# Patient Record
Sex: Male | Born: 1970 | Race: White | Hispanic: No | Smoking: Former smoker
Health system: Southern US, Community
[De-identification: ages and names within clinical notes are randomized; demographics above are authoritative.]

## PROBLEM LIST (undated history)

## (undated) DIAGNOSIS — G47419 Narcolepsy without cataplexy: Secondary | ICD-10-CM

## (undated) DIAGNOSIS — S069XAA Unspecified intracranial injury with loss of consciousness status unknown, initial encounter: Secondary | ICD-10-CM

## (undated) DIAGNOSIS — R262 Difficulty in walking, not elsewhere classified: Secondary | ICD-10-CM

## (undated) DIAGNOSIS — Z9889 Other specified postprocedural states: Secondary | ICD-10-CM

## (undated) DIAGNOSIS — R42 Dizziness and giddiness: Secondary | ICD-10-CM

## (undated) DIAGNOSIS — E079 Disorder of thyroid, unspecified: Secondary | ICD-10-CM

## (undated) DIAGNOSIS — R51 Headache: Secondary | ICD-10-CM

## (undated) DIAGNOSIS — N200 Calculus of kidney: Secondary | ICD-10-CM

## (undated) DIAGNOSIS — R479 Unspecified speech disturbances: Secondary | ICD-10-CM

## (undated) DIAGNOSIS — S069X9A Unspecified intracranial injury with loss of consciousness of unspecified duration, initial encounter: Secondary | ICD-10-CM

## (undated) DIAGNOSIS — R5383 Other fatigue: Secondary | ICD-10-CM

## (undated) DIAGNOSIS — R519 Headache, unspecified: Secondary | ICD-10-CM

## (undated) HISTORY — DX: Calculus of kidney: N20.0

## (undated) HISTORY — PX: HERNIA REPAIR: SHX51

## (undated) HISTORY — DX: Headache, unspecified: R51.9

## (undated) HISTORY — DX: Headache: R51

## (undated) HISTORY — PX: APPENDECTOMY: SHX54

## (undated) HISTORY — DX: Unspecified speech disturbances: R47.9

## (undated) HISTORY — PX: LITHOTRIPSY: SUR834

## (undated) HISTORY — DX: Other fatigue: R53.83

## (undated) HISTORY — PX: FACIAL RECONSTRUCTION SURGERY: SHX631

## (undated) HISTORY — DX: Dizziness and giddiness: R42

## (undated) HISTORY — DX: Difficulty in walking, not elsewhere classified: R26.2

---

## 1997-09-10 ENCOUNTER — Emergency Department (HOSPITAL_COMMUNITY): Admission: EM | Admit: 1997-09-10 | Discharge: 1997-09-10 | Payer: Self-pay | Admitting: Emergency Medicine

## 1997-11-01 ENCOUNTER — Emergency Department (HOSPITAL_COMMUNITY): Admission: EM | Admit: 1997-11-01 | Discharge: 1997-11-01 | Payer: Self-pay | Admitting: *Deleted

## 1997-11-09 ENCOUNTER — Ambulatory Visit (HOSPITAL_COMMUNITY): Admission: RE | Admit: 1997-11-09 | Discharge: 1997-11-09 | Payer: Self-pay | Admitting: *Deleted

## 1999-03-29 ENCOUNTER — Ambulatory Visit (HOSPITAL_COMMUNITY): Admission: RE | Admit: 1999-03-29 | Discharge: 1999-03-29 | Payer: Self-pay

## 1999-03-29 ENCOUNTER — Encounter (INDEPENDENT_AMBULATORY_CARE_PROVIDER_SITE_OTHER): Payer: Self-pay | Admitting: Specialist

## 1999-09-11 ENCOUNTER — Emergency Department (HOSPITAL_COMMUNITY): Admission: EM | Admit: 1999-09-11 | Discharge: 1999-09-11 | Payer: Self-pay | Admitting: Internal Medicine

## 1999-09-11 ENCOUNTER — Encounter: Payer: Self-pay | Admitting: Emergency Medicine

## 2003-10-17 ENCOUNTER — Emergency Department (HOSPITAL_COMMUNITY): Admission: EM | Admit: 2003-10-17 | Discharge: 2003-10-17 | Payer: Self-pay | Admitting: Emergency Medicine

## 2004-04-19 ENCOUNTER — Emergency Department (HOSPITAL_COMMUNITY): Admission: EM | Admit: 2004-04-19 | Discharge: 2004-04-19 | Payer: Self-pay | Admitting: Emergency Medicine

## 2004-11-13 ENCOUNTER — Emergency Department (HOSPITAL_COMMUNITY): Admission: EM | Admit: 2004-11-13 | Discharge: 2004-11-13 | Payer: Self-pay | Admitting: Emergency Medicine

## 2004-12-06 ENCOUNTER — Ambulatory Visit (HOSPITAL_COMMUNITY): Admission: RE | Admit: 2004-12-06 | Discharge: 2004-12-06 | Payer: Self-pay | Admitting: *Deleted

## 2005-06-10 ENCOUNTER — Emergency Department: Payer: Self-pay | Admitting: Emergency Medicine

## 2006-02-07 ENCOUNTER — Emergency Department (HOSPITAL_COMMUNITY): Admission: EM | Admit: 2006-02-07 | Discharge: 2006-02-08 | Payer: Self-pay | Admitting: Emergency Medicine

## 2008-11-20 ENCOUNTER — Emergency Department (HOSPITAL_BASED_OUTPATIENT_CLINIC_OR_DEPARTMENT_OTHER): Admission: EM | Admit: 2008-11-20 | Discharge: 2008-11-20 | Payer: Self-pay | Admitting: Emergency Medicine

## 2010-07-07 NOTE — Op Note (Signed)
Alex Crawford, Alex Crawford                ACCOUNT NO.:  [id number]   MEDICAL RECORD NO.:  [id number]          PATIENT TYPE:  AMB   LOCATION:  DAY                          FACILITY:  [facility]   PHYSICIAN:  Vikki Ports, MDDATE OF BIRTH:  Aug 20, 1970   DATE OF PROCEDURE:  12/06/2004  DATE OF DISCHARGE:                                 OPERATIVE REPORT   PREOPERATIVE DIAGNOSIS:  Right inguinal hernia.   POSTOPERATIVE DIAGNOSIS:  Right inguinal hernia, varicocele.   PROCEDURE:  Ligation of varicocele and right inguinal hernia repair with  mesh.   ANESTHESIA:  General.   SURGEON:  Vikki Ports, MD   DESCRIPTION:  The patient was taken to the operating room and placed in a  supine position.  After adequate general anesthesia was induced using  endotracheal tube, the right groin and perineum were prepped and draped in a  normal sterile fashion.  Using an oblique incision over the right inguinal  canal, I dissected down through subcutaneous tissue onto the external  oblique fascia.  This was opened along its fibers down to the external ring.  The ilioinguinal nerve was not identified. The spermatic cord was surrounded  down at the external ring.  A very, very large indirect hernia sac was  identified and dissected off the cord.  It was so large at the mouth of the  internal ring, I opted to open the hernia sac and use a purse-string 2-0  silk ligature to close it from the inside of the sac.  There was some bowel  contained within it, which I reduced prior to closing the sac.  The distal  part of the sac was resected.  There was a very large varicocele, which was  injured while doing the purse-string suture, which caused a small hematoma  in the cord, which was controlled with small clamps and ligation.  The vas  deferens was preserved, as was the blood supply to the testicle.  The floor  of Hesselbach's triangle was actually fairly well intact, but the internal  ring was so wide  open, I opted to use a Prolene hernia system so that I  could place some preperitoneal mesh.  The area around the internal ring was  bluntly dissected with my finger and with a sponge.  The preperitoneal  portion of the Prolene mesh, Prolene hernia system, was then placed in the  pre-peritoneal space.  The onlay was then tacked to the external ring at the  pubic tubercle along the shelving edge of Cooper's ligament into the  transversalis fascia, split and brought around the spermatic cord and tacked  out laterally with running 2-0 Prolene sutures.  I was satisfied with the  repair.  Adequate hemostasis was insured.  The external oblique fascia was  closed with running 3-0 Vicryl.  Skin was closed with staples.  Patient  tolerated the procedure well and went to the PACU in good condition.      Vikki Ports, MD  Electronically Signed     KRH/MEDQ  D:  12/06/2004  T:  12/06/2004  Job:  [id number]

## 2013-10-08 ENCOUNTER — Emergency Department (HOSPITAL_BASED_OUTPATIENT_CLINIC_OR_DEPARTMENT_OTHER)
Admission: EM | Admit: 2013-10-08 | Discharge: 2013-10-08 | Disposition: A | Discharge status: Home / Self Care | Payer: Self-pay | Attending: Emergency Medicine | Admitting: Emergency Medicine

## 2013-10-08 ENCOUNTER — Encounter (HOSPITAL_BASED_OUTPATIENT_CLINIC_OR_DEPARTMENT_OTHER): Payer: Self-pay | Admitting: Emergency Medicine

## 2013-10-08 DIAGNOSIS — N489 Disorder of penis, unspecified: Secondary | ICD-10-CM | POA: Insufficient documentation

## 2013-10-08 DIAGNOSIS — E079 Disorder of thyroid, unspecified: Secondary | ICD-10-CM | POA: Insufficient documentation

## 2013-10-08 DIAGNOSIS — Z79899 Other long term (current) drug therapy: Secondary | ICD-10-CM | POA: Insufficient documentation

## 2013-10-08 DIAGNOSIS — L089 Local infection of the skin and subcutaneous tissue, unspecified: Secondary | ICD-10-CM | POA: Insufficient documentation

## 2013-10-08 DIAGNOSIS — Z8669 Personal history of other diseases of the nervous system and sense organs: Secondary | ICD-10-CM | POA: Insufficient documentation

## 2013-10-08 DIAGNOSIS — Z87442 Personal history of urinary calculi: Secondary | ICD-10-CM | POA: Insufficient documentation

## 2013-10-08 DIAGNOSIS — Z792 Long term (current) use of antibiotics: Secondary | ICD-10-CM | POA: Insufficient documentation

## 2013-10-08 DIAGNOSIS — F17200 Nicotine dependence, unspecified, uncomplicated: Secondary | ICD-10-CM | POA: Insufficient documentation

## 2013-10-08 HISTORY — DX: Narcolepsy without cataplexy: G47.419

## 2013-10-08 HISTORY — DX: Disorder of thyroid, unspecified: E07.9

## 2013-10-08 HISTORY — DX: Calculus of kidney: N20.0

## 2013-10-08 MED ORDER — SULFAMETHOXAZOLE-TRIMETHOPRIM 800-160 MG PO TABS: 2.0000 | ORAL_TABLET | Freq: Two times a day (BID) | ORAL | Status: DC

## 2013-10-08 NOTE — ED Notes (Signed)
Pt c/o "spider bite to penis" x 1 d ay

## 2013-10-08 NOTE — ED Notes (Signed)
NP at bedside.

## 2013-10-08 NOTE — ED Provider Notes (Signed)
CSN: [id number]     Arrival date & time 10/08/13  1511 History   First MD Initiated Contact with Patient 10/08/13 1540     Chief Complaint  Patient presents with  . Insect Bite     (Consider location/radiation/quality/duration/timing/severity/associated sxs/prior Treatment) HPI Comments: Pt state that he felt a pain near his penis last night and he thinks he was bit by something. Pt states that it feels like there it a bump to the area. No drainage. No problems with urination. No vaginal discharge.  The history is provided by the patient. No language interpreter was used.    Past Medical History  Diagnosis Date  . Thyroid disease   . Kidney stone   . Narcolepsy    Past Surgical History  Procedure Laterality Date  . Facial reconstruction surgery    . Appendectomy    . Hernia repair    . Lithotripsy     History reviewed. No pertinent family history. History  Substance Use Topics  . Smoking status: Current Every Day Smoker -- 1.00 packs/day    Types: Cigarettes  . Smokeless tobacco: Not on file  . Alcohol Use: No    Review of Systems  Constitutional: Negative.   Respiratory: Negative.   Cardiovascular: Negative.       Allergies  Review of patient's allergies indicates no known allergies.  Home Medications   Prior to Admission medications   Medication Sig Start Date End Date Taking? Authorizing Provider  amphetamine-dextroamphetamine (ADDERALL) 30 MG tablet Take 30 mg by mouth daily.   Yes Historical Provider, MD  levothyroxine (SYNTHROID, LEVOTHROID) 150 MCG tablet Take 150 mcg by mouth daily before breakfast.   Yes Historical Provider, MD  sulfamethoxazole-trimethoprim (SEPTRA DS) 800-160 MG per tablet Take 2 tablets by mouth 2 (two) times daily. 10/08/13   Teressa LowerVrinda Alondra Vandeven, NP   BP 116/83  Pulse 72  Temp(Src) 97.7 F (36.5 C) (Oral)  Resp 16  Ht 5\' 5"  (1.651 m)  Wt 120 lb (54.432 kg)  BMI 19.97 kg/m2  SpO2 100% Physical Exam  Nursing note and vitals  reviewed. Constitutional: He is oriented to person, place, and time. He appears well-developed and well-nourished.  Cardiovascular: Normal rate and regular rhythm.   Pulmonary/Chest: Effort normal and breath sounds normal.  Genitourinary:  Small raised red area to shaft of the penis. No induration or fluctuance noted to the area. Area measures about 1 cm in diameter  Musculoskeletal: Normal range of motion.  Neurological: He is alert and oriented to person, place, and time.    ED Course  Procedures (including critical care time) Labs Review Labs Reviewed - No data to display  Imaging Review No results found.   EKG Interpretation None      MDM   Final diagnoses:  Skin infection    Area consistent with folliculitis vs. Early abscess. Will treat with bactrim. Discussed return precautions with pt    Teressa LowerVrinda Lenzie Montesano, NP 10/08/13 1616

## 2013-10-08 NOTE — ED Provider Notes (Signed)
Medical screening examination/treatment/procedure(s) were performed by non-physician practitioner and as supervising physician I was immediately available for consultation/collaboration.   EKG Interpretation None        David H Yao, MD 10/08/13 2232 

## 2013-10-11 ENCOUNTER — Encounter (HOSPITAL_BASED_OUTPATIENT_CLINIC_OR_DEPARTMENT_OTHER): Payer: Self-pay | Admitting: Emergency Medicine

## 2013-10-11 ENCOUNTER — Emergency Department (HOSPITAL_BASED_OUTPATIENT_CLINIC_OR_DEPARTMENT_OTHER)
Admission: EM | Admit: 2013-10-11 | Discharge: 2013-10-11 | Disposition: A | Discharge status: Home / Self Care | Payer: Self-pay | Attending: Emergency Medicine | Admitting: Emergency Medicine

## 2013-10-11 DIAGNOSIS — Z8639 Personal history of other endocrine, nutritional and metabolic disease: Secondary | ICD-10-CM | POA: Insufficient documentation

## 2013-10-11 DIAGNOSIS — Z862 Personal history of diseases of the blood and blood-forming organs and certain disorders involving the immune mechanism: Secondary | ICD-10-CM | POA: Insufficient documentation

## 2013-10-11 DIAGNOSIS — Z792 Long term (current) use of antibiotics: Secondary | ICD-10-CM | POA: Insufficient documentation

## 2013-10-11 DIAGNOSIS — F17200 Nicotine dependence, unspecified, uncomplicated: Secondary | ICD-10-CM | POA: Insufficient documentation

## 2013-10-11 DIAGNOSIS — Z8669 Personal history of other diseases of the nervous system and sense organs: Secondary | ICD-10-CM | POA: Insufficient documentation

## 2013-10-11 DIAGNOSIS — N4829 Other inflammatory disorders of penis: Secondary | ICD-10-CM | POA: Insufficient documentation

## 2013-10-11 DIAGNOSIS — R109 Unspecified abdominal pain: Secondary | ICD-10-CM | POA: Insufficient documentation

## 2013-10-11 DIAGNOSIS — N489 Disorder of penis, unspecified: Secondary | ICD-10-CM

## 2013-10-11 DIAGNOSIS — Z79899 Other long term (current) drug therapy: Secondary | ICD-10-CM | POA: Insufficient documentation

## 2013-10-11 DIAGNOSIS — Z87442 Personal history of urinary calculi: Secondary | ICD-10-CM | POA: Insufficient documentation

## 2013-10-11 MED ORDER — AZITHROMYCIN 250 MG PO TABS
1000.0000 mg | ORAL_TABLET | Freq: Every day | ORAL | Status: DC
  Filled 2013-10-11: qty 4

## 2013-10-11 MED ORDER — AZITHROMYCIN 250 MG PO TABS
1000.0000 mg | ORAL_TABLET | Freq: Every day | ORAL | Status: DC
  Administered 2013-10-11: 1000 mg via ORAL

## 2013-10-11 NOTE — Discharge Instructions (Signed)
FOLLOW UP WITH YOUR DOCTOR FOR RECHECK IN 2-3 DAYS. CONTINUE THE ANTIBIOTICS YOU ARE CURRENTLY TAKING UNTIL GONE. RETURN HERE AS NEEDED.

## 2013-10-11 NOTE — ED Provider Notes (Signed)
CSN: [id number]     Arrival date & time 10/11/13  2146 History   First MD Initiated Contact with Patient 10/11/13 2158     Chief Complaint  Patient presents with  . Abscess     (Consider location/radiation/quality/duration/timing/severity/associated sxs/prior Treatment) Patient is a 43 y.o. male presenting with abscess. The history is provided by the patient. No language interpreter was used.  Abscess Location:  Ano-genital Ano-genital abscess location:  Penis Associated symptoms: no fever, no nausea and no vomiting   Associated symptoms comment:  Painful lesion to shaft of penis that is worsening. He was seen in the emergency room 2 nights ago and started on antibiotics for an abscess. He states that the lesion opened and drained earlier today causing concern that it was worsening. He reports abdominal pain but no fever or vomiting. No dysuria.    Past Medical History  Diagnosis Date  . Thyroid disease   . Kidney stone   . Narcolepsy    Past Surgical History  Procedure Laterality Date  . Facial reconstruction surgery    . Appendectomy    . Hernia repair    . Lithotripsy     History reviewed. No pertinent family history. History  Substance Use Topics  . Smoking status: Current Every Day Smoker -- 1.00 packs/day    Types: Cigarettes  . Smokeless tobacco: Not on file  . Alcohol Use: No    Review of Systems  Constitutional: Negative for fever and chills.  Gastrointestinal: Positive for abdominal pain. Negative for nausea and vomiting.  Genitourinary: Positive for penile pain. Negative for dysuria.  Musculoskeletal: Negative.  Negative for myalgias.  Skin: Positive for wound.  Neurological: Negative.       Allergies  Review of patient's allergies indicates no known allergies.  Home Medications   Prior to Admission medications   Medication Sig Start Date End Date Taking? Authorizing Provider  amphetamine-dextroamphetamine (ADDERALL) 30 MG tablet Take 30 mg by  mouth daily.    Historical Provider, MD  levothyroxine (SYNTHROID, LEVOTHROID) 150 MCG tablet Take 150 mcg by mouth daily before breakfast.    Historical Provider, MD  sulfamethoxazole-trimethoprim (SEPTRA DS) 800-160 MG per tablet Take 2 tablets by mouth 2 (two) times daily. 10/08/13   Teressa Lower, NP   BP 135/85  Pulse 61  Temp(Src) 98.1 F (36.7 C) (Oral)  Resp 18  Ht  (1.651 m)  Wt 120 lb (54.432 kg)  BMI 19.97 kg/m2  SpO2 97% Physical Exam  Constitutional: He is oriented to person, place, and time. He appears well-developed and well-nourished.  Neck: Normal range of motion.  Pulmonary/Chest: Effort normal.  Genitourinary:  Circular lesion to shaft of penis without induration or active drainage. No necrosis. Moderately tender.   Musculoskeletal: Normal range of motion.  Lymphadenopathy:       Right: Inguinal adenopathy present.       Left: Inguinal adenopathy present.  Lymph nodes in groin are tender.   Neurological: He is alert and oriented to person, place, and time.  Skin: Skin is warm and dry.  Psychiatric: He has a normal mood and affect.    ED Course  Procedures (including critical care time) Labs Review Labs Reviewed  RPR  HIV ANTIBODY (ROUTINE TESTING)    Imaging Review No results found.   EKG Interpretation None      MDM   Final diagnoses:  None    1. Penile lesion  Patient treated for chancroid with RPR and HIV pending. Encouraged him to continue  and complete current antibiotic. Stable for discharge home.    Arnoldo Hooker, PA-C 10/11/13 2242

## 2013-10-11 NOTE — ED Notes (Signed)
Pt reports he was seen at this facility last Thursday for a suspected spider bite on his penis, pt states he was prescribed antibiotics at that time - today pt noted the area was black in color and brown/white purulent drainage. Denies fever.

## 2013-10-11 NOTE — ED Notes (Signed)
Provider at bedside for pt eval.

## 2013-10-12 LAB — HIV ANTIBODY (ROUTINE TESTING W REFLEX): HIV 1&2 Ab, 4th Generation: NONREACTIVE

## 2013-10-12 LAB — RPR

## 2013-10-13 NOTE — ED Provider Notes (Signed)
Medical screening examination/treatment/procedure(s) were conducted as a shared visit with non-physician practitioner(s) and myself.  I personally evaluated the patient during the encounter.   EKG Interpretation None      Pt seen and evaluated.  Reports penile lesion with drainage and groin pain.  Exam with 1cm ulceration with purulence, but no abscess.  Palpable inguinal LAD.  Agree with RPR, initiate tx for possible Chancroid.  Azithromax, Bicillin for pending RPR.  Rolland Porter, MD 10/13/13 [phone]

## 2014-03-02 ENCOUNTER — Encounter (HOSPITAL_COMMUNITY): Payer: Self-pay

## 2014-03-02 ENCOUNTER — Ambulatory Visit (HOSPITAL_COMMUNITY)
Admission: RE | Admit: 2014-03-02 | Discharge: 2014-03-02 | Disposition: A | Discharge status: Home / Self Care | Payer: Medicaid Other | Source: Ambulatory Visit | Attending: Student | Admitting: Student

## 2014-03-02 DIAGNOSIS — S062X3D Diffuse traumatic brain injury with loss of consciousness of 1 hour to 5 hours 59 minutes, subsequent encounter: Secondary | ICD-10-CM | POA: Diagnosis not present

## 2014-03-02 DIAGNOSIS — R262 Difficulty in walking, not elsewhere classified: Secondary | ICD-10-CM

## 2014-03-02 DIAGNOSIS — S069X3D Unspecified intracranial injury with loss of consciousness of 1 hour to 5 hours 59 minutes, subsequent encounter: Secondary | ICD-10-CM

## 2014-03-02 DIAGNOSIS — S062X2D Diffuse traumatic brain injury with loss of consciousness of 31 minutes to 59 minutes, subsequent encounter: Secondary | ICD-10-CM | POA: Diagnosis present

## 2014-03-02 DIAGNOSIS — M6281 Muscle weakness (generalized): Secondary | ICD-10-CM | POA: Diagnosis not present

## 2014-03-02 DIAGNOSIS — R2689 Other abnormalities of gait and mobility: Secondary | ICD-10-CM

## 2014-03-02 DIAGNOSIS — G8194 Hemiplegia, unspecified affecting left nondominant side: Secondary | ICD-10-CM

## 2014-03-02 DIAGNOSIS — R29898 Other symptoms and signs involving the musculoskeletal system: Secondary | ICD-10-CM

## 2014-03-02 DIAGNOSIS — S069X3A Unspecified intracranial injury with loss of consciousness of 1 hour to 5 hours 59 minutes, initial encounter: Secondary | ICD-10-CM

## 2014-03-02 DIAGNOSIS — R279 Unspecified lack of coordination: Secondary | ICD-10-CM

## 2014-03-02 NOTE — Patient Instructions (Signed)
Toe / Heel Raise (Sitting)   Sitting, raise heels, then rock back on heels and raise toes. Repeat __15__ times.  Copyright  VHI. All rights reserved.  Bridging   Slowly raise buttocks from floor, keeping stomach tight. Repeat __10__ times per set. Do _1___ sets per session. Do __2__ sessions per day.  http://orth.exer.us/1096   Copyright  VHI. All rights reserved.  Straight Leg Raise   Tighten stomach and slowly raise locked right leg _18___ inches from floor. Repeat __10-15__ times per set. Do ____ sets per session. Do __2__ sessions per day.  http://orth.exer.us/1102   Copyright  VHI. All rights reserved.  Strengthening: Hip Abduction (Side-Lying)   Tighten muscles on front of left thigh, then lift leg __15__ inches from surface, keeping knee locked.  Repeat ___15_ times per set. Do ____ sets per session. Do _2___ sessions per day.  http://orth.exer.us/622   Copyright  VHI. All rights reserved.  Strengthening: Hip Abduction (Side-Lying)   Tighten muscles on front of left thigh, then lift leg ____ inches from surface, keeping knee locked.  Repeat ____ times per set. Do ____ sets per session. Do ____ sessions per day.  http://orth.exer.us/622   Copyright  VHI. All rights reserved.
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
## 2014-03-02 NOTE — Patient Instructions (Signed)
Weightbearing technique handout
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
## 2014-03-05 ENCOUNTER — Telehealth (HOSPITAL_COMMUNITY): Payer: Self-pay

## 2014-03-05 NOTE — Therapy (Signed)
[place], [place], [address] Phone: [phone]   Fax:  [phone]  Patient Details  Name: Alex Crawford MRN: [id number] Date of Birth: March 18, 1970 Referring Provider:  Cleatrice Burke, MD  Encounter Date: 03/02/2014  PHYSICAL THERAPY DISCHARGE SUMMARY  Visits from Start of Care: 1 Current functional level related to goals / functional outcomes: same   Remaining deficits: Strength, balance, activity tolerance Education / Equipment: HEP  Plan: Patient agrees to discharge.  Patient goals were not met. Patient is being discharged due to the patient's request.  ?????   PT is going to receive [facility] 03/05/2014, 11:30 AM  [facility] [place], [place], [address] Phone: [phone]   Fax:  [phone]

## 2014-03-05 NOTE — Addendum Note (Signed)
Encounter addended by: Bella Kennedyynthia J Russell, PT on: 03/05/2014 11:31 AM<BR>     Documentation filed: Clinical Notes

## 2014-03-05 NOTE — Telephone Encounter (Signed)
Lucky from [facility] called to say they would be treating him.  Mom said it was hard to get him out and about.

## 2014-03-08 ENCOUNTER — Encounter (HOSPITAL_COMMUNITY): Payer: Self-pay

## 2014-03-08 ENCOUNTER — Encounter (HOSPITAL_COMMUNITY): Payer: Medicaid Other | Admitting: Speech Pathology

## 2014-03-08 NOTE — Therapy (Signed)
[place], [place], [address] Phone: [phone]   Fax:  [phone]  Patient Details  Name: Alex Crawford MRN: [id number] Date of Birth: 12-30-1970 Referring Provider:  No ref. provider found  Encounter Date: 03/08/2014 OCCUPATIONAL THERAPY DISCHARGE SUMMARY  Visits from Start of Care: 1  Current functional level related to goals / functional outcomes: OT LONG TERM GOAL #1    Title Patient will achieve highest level of independence with bathing and dressing by completing at [facility].   Time 8   Period Weeks   Status New   OT LONG TERM GOAL #2   Title Patient will increase grip strength by 10# and pinch strength by 5# to increase ability to assist with ADL tasks.    Time 8   Period Weeks   Status New   OT LONG TERM GOAL #3   Title Patient will increase LUE strength to 2-/5 to increase ability to complete dressing tasks with Alex difficulty.    Time 8   Period Weeks   Status New   OT LONG TERM GOAL #4   Title Patient will increase LUE shoulder and elbow AROM by 5 degrees to increase ability to complete daily tasks.    Time 8   Period Weeks   Status New   OT LONG TERM GOAL #5   Title Patient will use Left as gross assist with daily tasks.    Time 8   Period Weeks   Status New      Remaining deficits: Patient was seen for initial OT evaluation and mother asked to have patient discharged as he will be receiving Home Health therapy instead. Mother is having a difficult time trying to get patient out of the house to make his appointments.    Education / Equipment: Weightbearing activities Plan: Patient agrees to discharge.  Patient goals were not met. Patient is being discharged due to the patient's request.  ?????       Ailene Ravel, OTR/L,CBIS  [phone]  03/08/2014, 3:30 PM  [place] [address] [place], [place], [address] Phone: [phone]   Fax:  [phone]

## 2014-03-11 ENCOUNTER — Encounter (HOSPITAL_COMMUNITY): Payer: Medicaid Other | Admitting: Speech Pathology

## 2014-03-11 ENCOUNTER — Ambulatory Visit (HOSPITAL_COMMUNITY): Payer: Medicaid Other

## 2014-03-17 ENCOUNTER — Ambulatory Visit (HOSPITAL_COMMUNITY): Payer: Medicaid Other | Admitting: Physical Therapy

## 2014-03-17 ENCOUNTER — Encounter (HOSPITAL_COMMUNITY): Payer: Medicaid Other | Admitting: Speech Pathology

## 2014-03-17 ENCOUNTER — Encounter (HOSPITAL_COMMUNITY): Payer: Medicaid Other | Admitting: Specialist

## 2014-03-19 ENCOUNTER — Encounter (HOSPITAL_COMMUNITY): Payer: Medicaid Other | Admitting: Specialist

## 2014-03-19 ENCOUNTER — Ambulatory Visit (HOSPITAL_COMMUNITY): Payer: Medicaid Other

## 2014-03-22 ENCOUNTER — Encounter (HOSPITAL_COMMUNITY): Payer: Medicaid Other | Admitting: Specialist

## 2014-03-22 ENCOUNTER — Ambulatory Visit (HOSPITAL_COMMUNITY): Payer: Medicaid Other | Admitting: Physical Therapy

## 2014-03-22 ENCOUNTER — Encounter (HOSPITAL_COMMUNITY): Payer: Medicaid Other | Admitting: Speech Pathology

## 2014-03-25 ENCOUNTER — Encounter (HOSPITAL_COMMUNITY): Payer: Medicaid Other | Admitting: Speech Pathology

## 2014-03-25 ENCOUNTER — Ambulatory Visit (HOSPITAL_COMMUNITY): Payer: Medicaid Other | Admitting: Physical Therapy

## 2014-03-30 ENCOUNTER — Encounter (HOSPITAL_COMMUNITY): Payer: Medicaid Other | Admitting: Speech Pathology

## 2014-03-30 ENCOUNTER — Encounter (HOSPITAL_COMMUNITY): Payer: Medicaid Other

## 2014-03-30 ENCOUNTER — Ambulatory Visit (HOSPITAL_COMMUNITY): Payer: Medicaid Other | Admitting: Physical Therapy

## 2014-06-22 ENCOUNTER — Emergency Department (HOSPITAL_COMMUNITY)
Admission: EM | Admit: 2014-06-22 | Discharge: 2014-06-23 | Disposition: A | Discharge status: Home / Self Care | Payer: 59 | Attending: Emergency Medicine | Admitting: Emergency Medicine

## 2014-06-22 ENCOUNTER — Encounter (HOSPITAL_COMMUNITY): Payer: Self-pay | Admitting: Emergency Medicine

## 2014-06-22 DIAGNOSIS — Z87828 Personal history of other (healed) physical injury and trauma: Secondary | ICD-10-CM | POA: Insufficient documentation

## 2014-06-22 DIAGNOSIS — E079 Disorder of thyroid, unspecified: Secondary | ICD-10-CM | POA: Insufficient documentation

## 2014-06-22 DIAGNOSIS — Z79899 Other long term (current) drug therapy: Secondary | ICD-10-CM | POA: Diagnosis not present

## 2014-06-22 DIAGNOSIS — Z720 Tobacco use: Secondary | ICD-10-CM | POA: Insufficient documentation

## 2014-06-22 DIAGNOSIS — H538 Other visual disturbances: Secondary | ICD-10-CM | POA: Diagnosis not present

## 2014-06-22 DIAGNOSIS — Z87442 Personal history of urinary calculi: Secondary | ICD-10-CM | POA: Insufficient documentation

## 2014-06-22 DIAGNOSIS — H5712 Ocular pain, left eye: Secondary | ICD-10-CM | POA: Diagnosis present

## 2014-06-22 DIAGNOSIS — H539 Unspecified visual disturbance: Secondary | ICD-10-CM

## 2014-06-22 HISTORY — DX: Unspecified intracranial injury with loss of consciousness status unknown, initial encounter: S06.9XAA

## 2014-06-22 HISTORY — DX: Unspecified intracranial injury with loss of consciousness of unspecified duration, initial encounter: S06.9X9A
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
## 2014-06-22 NOTE — ED Notes (Signed)
Pt.'s mother reported progressing left eye redness onset this week , denies injury , no drainage or vision loss.

## 2014-06-23 MED ORDER — TETRACAINE HCL 0.5 % OP SOLN
1.0000 [drp] | Freq: Once | OPHTHALMIC | Status: AC
  Administered 2014-06-23: 1 [drp] via OPHTHALMIC
  Filled 2014-06-23: qty 2

## 2014-06-23 MED ORDER — FLUORESCEIN SODIUM 1 MG OP STRP
1.0000 | ORAL_STRIP | Freq: Once | OPHTHALMIC | Status: AC
  Administered 2014-06-23: 1 via OPHTHALMIC
  Filled 2014-06-23: qty 1

## 2014-06-23 MED ORDER — MOXIFLOXACIN HCL 0.5 % OP SOLN: 1.0000 [drp] | OPHTHALMIC | Status: AC

## 2014-06-23 NOTE — ED Notes (Signed)
Pt c/o decreased vision in left eye.  Pt with a hx of TBI with remaining deficits to include left sided weakness and and verbal changes.  Pt's mother sts pt often has reddening in his eye with some blurred vision.  Pt takes eye drops for this condition.  However tonight the pt woke up from his sleep and was crying because he "could not see" out of his left eye.    Redness noted to left eye.

## 2014-06-23 NOTE — Discharge Instructions (Signed)
1. Medications: Vigamox, usual home medications 2. Treatment: rest, drink plenty of fluids,  3. Follow Up: Please followup with the ophthalmologist TOMORROW for further evaluation    Blurred Vision You have been seen today complaining of blurred vision. This means you have a loss of ability to see small details.  CAUSES  Blurred vision can be a symptom of underlying eye problems, such as:  Aging of the eye (presbyopia).  Glaucoma.  Cataracts.  Eye infection.  Eye-related migraine.  Diabetes mellitus.  Fatigue.  Migraine headaches.  High blood pressure.  Breakdown of the back of the eye (macular degeneration).  Problems caused by some medications. The most common cause of blurred vision is the need for eyeglasses or a new prescription. Today in the emergency department, no cause for your blurred vision can be found. SYMPTOMS  Blurred vision is the loss of visual sharpness and detail (acuity). DIAGNOSIS  Should blurred vision continue, you should see your caregiver. If your caregiver is your primary care physician, he or she may choose to refer you to another specialist.  TREATMENT  Do not ignore your blurred vision. Make sure to have it checked out to see if further treatment or referral is necessary. SEEK MEDICAL CARE IF:  You are unable to get into a specialist so we can help you with a referral. SEEK IMMEDIATE MEDICAL CARE IF: You have severe eye pain, severe headache, or sudden loss of vision. MAKE SURE YOU:   Understand these instructions.  Will watch your condition.  Will get help right away if you are not doing well or get worse. Document Released: 02/08/2003 Document Revised: 04/30/2011 Document Reviewed: 09/10/2007 [facility] Patient Information 2015 [employer] [place]. This information is not intended to replace advice given to you by your health care provider. Make sure you discuss any questions you have with your health care provider.

## 2014-06-23 NOTE — ED Notes (Signed)
Attempted visual acuity. Was unable to complete. Pt suffers from TBI and is mentally unable to complete visual acuity test.

## 2014-06-23 NOTE — ED Notes (Signed)
Visual acuity attempted with pt by Baxter HireKristen, NT.  Pt unable to identify some lettering, or follow commands to complete the exam by NT.  Pt's mother attempted to help.  Pt began to be frustrated and tearful, and exam stopped and pt returned to room for further evaluation.

## 2014-09-06 ENCOUNTER — Emergency Department (HOSPITAL_COMMUNITY): Payer: 59

## 2014-09-06 ENCOUNTER — Emergency Department (HOSPITAL_COMMUNITY)
Admission: EM | Admit: 2014-09-06 | Discharge: 2014-09-06 | Disposition: A | Discharge status: Home / Self Care | Payer: 59 | Attending: Emergency Medicine | Admitting: Emergency Medicine

## 2014-09-06 ENCOUNTER — Encounter (HOSPITAL_COMMUNITY): Payer: Self-pay | Admitting: *Deleted

## 2014-09-06 DIAGNOSIS — R5383 Other fatigue: Secondary | ICD-10-CM | POA: Diagnosis not present

## 2014-09-06 DIAGNOSIS — Y998 Other external cause status: Secondary | ICD-10-CM | POA: Insufficient documentation

## 2014-09-06 DIAGNOSIS — Z8669 Personal history of other diseases of the nervous system and sense organs: Secondary | ICD-10-CM | POA: Insufficient documentation

## 2014-09-06 DIAGNOSIS — R358 Other polyuria: Secondary | ICD-10-CM | POA: Insufficient documentation

## 2014-09-06 DIAGNOSIS — Z79899 Other long term (current) drug therapy: Secondary | ICD-10-CM | POA: Diagnosis not present

## 2014-09-06 DIAGNOSIS — Y9389 Activity, other specified: Secondary | ICD-10-CM | POA: Insufficient documentation

## 2014-09-06 DIAGNOSIS — Z87891 Personal history of nicotine dependence: Secondary | ICD-10-CM | POA: Insufficient documentation

## 2014-09-06 DIAGNOSIS — R630 Anorexia: Secondary | ICD-10-CM | POA: Insufficient documentation

## 2014-09-06 DIAGNOSIS — Z87442 Personal history of urinary calculi: Secondary | ICD-10-CM | POA: Diagnosis not present

## 2014-09-06 DIAGNOSIS — R42 Dizziness and giddiness: Secondary | ICD-10-CM

## 2014-09-06 DIAGNOSIS — W050XXA Fall from non-moving wheelchair, initial encounter: Secondary | ICD-10-CM | POA: Diagnosis not present

## 2014-09-06 DIAGNOSIS — Z8639 Personal history of other endocrine, nutritional and metabolic disease: Secondary | ICD-10-CM | POA: Insufficient documentation

## 2014-09-06 DIAGNOSIS — Z043 Encounter for examination and observation following other accident: Secondary | ICD-10-CM | POA: Diagnosis not present

## 2014-09-06 DIAGNOSIS — Y9289 Other specified places as the place of occurrence of the external cause: Secondary | ICD-10-CM | POA: Insufficient documentation

## 2014-09-06 DIAGNOSIS — Z87820 Personal history of traumatic brain injury: Secondary | ICD-10-CM | POA: Diagnosis not present

## 2014-09-06 LAB — URINALYSIS, ROUTINE W REFLEX MICROSCOPIC
Bilirubin Urine: NEGATIVE
GLUCOSE, UA: NEGATIVE mg/dL
Hgb urine dipstick: NEGATIVE
Ketones, ur: NEGATIVE mg/dL
Leukocytes, UA: NEGATIVE
NITRITE: NEGATIVE
Protein, ur: NEGATIVE mg/dL
Specific Gravity, Urine: 1.019 (ref 1.005–1.030)
UROBILINOGEN UA: 0.2 mg/dL (ref 0.0–1.0)
pH: 6.5 (ref 5.0–8.0)

## 2014-09-06 LAB — BASIC METABOLIC PANEL
Anion gap: 8 (ref 5–15)
BUN: 16 mg/dL (ref 6–20)
CALCIUM: 9.7 mg/dL (ref 8.9–10.3)
CO2: 24 mmol/L (ref 22–32)
Chloride: 109 mmol/L (ref 101–111)
Creatinine, Ser: 0.87 mg/dL (ref 0.61–1.24)
GFR calc Af Amer: >60 mL/min (ref >60)
GLUCOSE: 98 mg/dL (ref 65–99)
Potassium: 4.4 mmol/L (ref 3.5–5.1)
SODIUM: 141 mmol/L (ref 135–145)

## 2014-09-06 LAB — CBC
HEMATOCRIT: 43.5 % (ref 39.0–52.0)
HEMOGLOBIN: 14.5 g/dL (ref 13.0–17.0)
MCH: 30.7 pg (ref 26.0–34.0)
MCHC: 33.3 g/dL (ref 30.0–36.0)
MCV: 92 fL (ref 78.0–100.0)
PLATELETS: 246 10*3/uL (ref 150–400)
RBC: 4.73 MIL/uL (ref 4.22–5.81)
RDW: 14.5 % (ref 11.5–15.5)
WBC: 7.3 10*3/uL (ref 4.0–10.5)

## 2014-09-06 LAB — MAGNESIUM: MAGNESIUM: 2 mg/dL (ref 1.7–2.4)

## 2014-09-06 MED ORDER — DIAZEPAM 5 MG PO TABS: 5.0000 mg | ORAL_TABLET | Freq: Three times a day (TID) | ORAL | Status: AC | PRN

## 2014-09-06 MED ORDER — DIAZEPAM 5 MG PO TABS
5.0000 mg | ORAL_TABLET | Freq: Once | ORAL | Status: AC
  Administered 2014-09-06: 5 mg via ORAL
  Filled 2014-09-06: qty 1

## 2014-09-06 MED ORDER — SODIUM CHLORIDE 0.9 % IV BOLUS (SEPSIS)
500.0000 mL | Freq: Once | INTRAVENOUS | Status: AC
  Administered 2014-09-06: 500 mL via INTRAVENOUS

## 2014-09-06 NOTE — ED Provider Notes (Signed)
CSN: [id number]     Arrival date & time 09/06/14  1543 History   First MD Initiated Contact with Patient 09/06/14 1701     Chief Complaint  Patient presents with  . Fall  . Dizziness     (Consider location/radiation/quality/duration/timing/severity/associated sxs/prior Treatment) HPI   Pt with hx TBI p/w dizziness, decreased appetite, fatigue, increased thirst, decreased urine that has become malodorous.  This began 4 days ago after a fall.  Mother notes pt is in a wheelchair, can get his feet going to move the chair forward but otherwise does not propell himself - he was placed in the doorway for some sunshine while she had a conversation and when she looked up he was not there.  She found him on the wheelchair ramp with his head against the rail and the wheelchair tipped over.  Pt has no memory of this event.  Pt denies any pain anywhere, including any headache or neck pain.  No recent cough, abdominal pain, vomiting, diarrhea, dysuria, URI symptoms.  Notes he has had dizziness (spinning) since his TBI in September 2015 but it had improved somewhat until 5 days ago.  [facility] symptom and meclizine was called in, pt notes is has not helped.    Level V caveat for patient's decreased ability to communicated due to TBI.  He is alert, gestures and uses some words and is able to answer questions yes/no, A or B.    Past Medical History  Diagnosis Date  . Thyroid disease   . Kidney stone   . Narcolepsy   . TBI (traumatic brain injury)    Past Surgical History  Procedure Laterality Date  . Facial reconstruction surgery    . Appendectomy    . Hernia repair    . Lithotripsy     No family history on file. History  Substance Use Topics  . Smoking status: Former Smoker -- 1.00 packs/day    Types: Cigarettes  . Smokeless tobacco: Not on file  . Alcohol Use: No    Review of Systems  Unable to perform ROS: Patient nonverbal       Allergies  Review of patient's allergies indicates no known allergies.  Home Medications   Prior to Admission medications   Medication Sig Start Date End Date Taking? Authorizing Provider  amphetamine-dextroamphetamine (ADDERALL) 30 MG tablet Take 30 mg by mouth daily.    Historical Provider, MD  baclofen (LIORESAL) 10 MG tablet Take 1 mg by mouth 3 (three) times daily.    Historical Provider, MD  clonazePAM (KLONOPIN) 0.5 MG tablet Take 0.5 mg by mouth 2 (two) times daily as needed for anxiety.    Historical Provider, MD  escitalopram (LEXAPRO) 20 MG tablet Take 20 mg by mouth daily.    Historical Provider, MD  gabapentin (NEURONTIN) 250 MG/5ML solution Take by mouth 3 (three) times daily.    Historical Provider, MD  levothyroxine (SYNTHROID, LEVOTHROID) 150 MCG tablet Take 150 mcg by mouth daily before breakfast.    Historical Provider, MD  moxifloxacin (VIGAMOX) 0.5 % ophthalmic solution Place 1 drop into both eyes See admin instructions. 06/23/14   Hannah Muthersbaugh, PA-C  nitrofurantoin (MACRODANTIN) 50 MG capsule Take 50 mg by mouth 4 (four) times daily.    Historical Provider, MD  polyvinyl alcohol (LIQUIFILM TEARS) 1.4 % ophthalmic solution 1 drop as needed for dry eyes.    Historical Provider, MD  sulfamethoxazole-trimethoprim (SEPTRA DS) 800-160 MG per tablet Take 2 tablets  by mouth 2 (two) times daily. Patient not taking: Reported on 03/02/2014 10/08/13   Teressa Lower, NP   BP 106/75 mmHg  Pulse 56  Temp(Src) 98.3 F (36.8 C) (Oral)  Resp 17  SpO2 97% Physical Exam  Constitutional: He appears well-developed and well-nourished. No distress.  HENT:  Head: Atraumatic.  Neck: Neck supple.  Cardiovascular: Normal rate and regular rhythm.   Pulmonary/Chest: Effort normal and breath sounds normal. No respiratory distress. He has no wheezes. He has no rales.  Abdominal: Soft. He exhibits no distension and no mass. There is no tenderness. There is no rebound and no  guarding.  Neurological: He is alert. He exhibits normal muscle tone.  Slight weakness in legs and left arm but strength is baseline, sensation intact.  Finger to nose slow but successful.   Skin: He is not diaphoretic.  Nursing note and vitals reviewed.   ED Course  Procedures (including critical care time) Labs Review Labs Reviewed  BASIC METABOLIC PANEL  CBC  URINALYSIS, ROUTINE W REFLEX MICROSCOPIC (NOT AT [facility])  MAGNESIUM    Imaging Review Ct Head Wo Contrast  09/06/2014   CLINICAL DATA:  Recent fall, dizziness, lethargy, staring off, past history of traumatic brain injury and limited speech, former smoker  EXAM: CT HEAD WITHOUT CONTRAST  CT CERVICAL SPINE WITHOUT CONTRAST  TECHNIQUE: Multidetector CT imaging of the head and cervical spine was performed following the standard protocol without intravenous contrast. Multiplanar CT image reconstructions of the cervical spine were also generated.  COMPARISON:  None  FINDINGS: CT HEAD FINDINGS  Mild generalized atrophy for age.  Normal ventricular morphology.  No midline shift or mass effect.  Otherwise normal appearance of brain parenchyma.  No intracranial hemorrhage, mass lesion or evidence acute infarction.  No extra-axial fluid collections.  Small old LEFT frontal burr hole.  No acute bone or sinus abnormalities.  CT CERVICAL SPINE FINDINGS  Orthopedic hardware at the mandible bilaterally.  Prevertebral soft tissues normal thickness.  Visualized skullbase intact.  Scattered disc space narrowing and endplate spur formation.  Uncovertebral spurs encroach upon the LEFT C5-C6 neural foramen.  Vertebral body heights maintained without fracture or subluxation.  Lung apices clear.  Soft tissues unremarkable.  IMPRESSION: Mild atrophy for age.  No acute intracranial abnormalities.  Degenerative disc disease changes cervical spine.  No acute cervical spine abnormalities.   Electronically Signed   By: Ulyses Southward M.D.   On: 09/06/2014 19:08   Ct  Cervical Spine Wo Contrast  09/06/2014   CLINICAL DATA:  Recent fall, dizziness, lethargy, staring off, past history of traumatic brain injury and limited speech, former smoker  EXAM: CT HEAD WITHOUT CONTRAST  CT CERVICAL SPINE WITHOUT CONTRAST  TECHNIQUE: Multidetector CT imaging of the head and cervical spine was performed following the standard protocol without intravenous contrast. Multiplanar CT image reconstructions of the cervical spine were also generated.  COMPARISON:  None  FINDINGS: CT HEAD FINDINGS  Mild generalized atrophy for age.  Normal ventricular morphology.  No midline shift or mass effect.  Otherwise normal appearance of brain parenchyma.  No intracranial hemorrhage, mass lesion or evidence acute infarction.  No extra-axial fluid collections.  Small old LEFT frontal burr hole.  No acute bone or sinus abnormalities.  CT CERVICAL SPINE FINDINGS  Orthopedic hardware at the mandible bilaterally.  Prevertebral soft tissues normal thickness.  Visualized skullbase intact.  Scattered disc space narrowing and endplate spur formation.  Uncovertebral spurs encroach upon the LEFT C5-C6 neural foramen.  Vertebral body  heights maintained without fracture or subluxation.  Lung apices clear.  Soft tissues unremarkable.  IMPRESSION: Mild atrophy for age.  No acute intracranial abnormalities.  Degenerative disc disease changes cervical spine.  No acute cervical spine abnormalities.   Electronically Signed   By: Ulyses Southward M.D.   On: 09/06/2014 19:08     EKG Interpretation   Date/Time:  Monday September 06 2014 16:10:36 EDT Ventricular Rate:  81 PR Interval:  124 QRS Duration: 86 QT Interval:  474 QTC Calculation: 550 R Axis:   90 Text Interpretation:   Critical Test Result: Long QTc Normal sinus  rhythm Rightward axis Cannot rule out Anterior infarct , age undetermined  Prolonged QT Abnormal ECG Nonspecific T wave abnormality No old tracing to  compare Confirmed by GOLDSTON  MD, SCOTT ([id number]) on  09/06/2014 5:30:59 PM      ED ECG REPORT REPEAT ECG  Date: 09/06/2014  Rate: 55  Rhythm: normal sinus rhythm  QRS Axis: normal  Intervals: normal  ST/T Wave abnormalities: nonspecific T wave changes  Conduction Disutrbances:none  Narrative Interpretation:   Old EKG Reviewed: Long QT in ekg earlier today not apparent in repeat EKG.  Prior reading like due to artifact.   I have personally reviewed the EKG tracing and agree with the computerized printout as noted.     MDM   Final diagnoses:  Hx of traumatic brain injury  Dizziness  Fall from wheelchair, initial encounter    Afebrile, nontoxic patient with hx TBI September 2015 with chronic dizziness, ongoing PT that has had slow improvement.  Fall from wheelchair with LOC 5 days ago, increased dizziness since then.  Also with decreased appetite, malodorous urine.  Possibility of new concussion from fall.  Head CT negative.  Pt without new neurologic deficits and no headache, only slight increase in spinning that he has had daily since TBI.  He also may have been slightly dehydrated given decreased appetite and decreased urination.  UA without apparent infection.  Labs unremarkable.  Pt denying any other symptoms.  Dizziness is slightly improved with valium.   D/C home with small prescription for valium, advised to follow up with neurologist and PCP this week.  Mother has not been happy with neurologist and long drive to [facility], requests neurologists in [place] - I have given her resources.  Discussed result, findings, treatment, and follow up  with patient.  Pt given return precautions.  Pt verbalizes understanding and agrees with plan.         Trixie Dredge, PA-C 09/06/14 2132  Pricilla Loveless, MD 09/07/14 [phone]

## 2014-09-06 NOTE — ED Notes (Signed)
MD at bedside. 

## 2014-09-06 NOTE — ED Notes (Signed)
Called to room by mother, pt c/o difficulty breathing and "like something in my throat." Lung sounds clear in all fields, oxygen sats 100%

## 2014-09-06 NOTE — ED Notes (Signed)
PT unable to provide UA at this time... 

## 2014-09-06 NOTE — Discharge Instructions (Signed)
Read the information below.  Use the prescribed medication as directed.  Please discuss all new medications with your pharmacist.  You may return to the Emergency Department at any time for worsening condition or any new symptoms that concern you.  Please see your doctor this week for a close follow up appointment.   Please also speak with your neurologist this week about your recent fall.     Dizziness Dizziness is a common problem. It is a feeling of unsteadiness or light-headedness. You may feel like you are about to faint. Dizziness can lead to injury if you stumble or fall. A person of any age group can suffer from dizziness, but dizziness is more common in older adults. CAUSES  Dizziness can be caused by many different things, including:  Middle ear problems.  Standing for too long.  Infections.  An allergic reaction.  Aging.  An emotional response to something, such as the sight of blood.  Side effects of medicines.  Tiredness.  Problems with circulation or blood pressure.  Excessive use of alcohol or medicines, or illegal drug use.  Breathing too fast (hyperventilation).  An irregular heart rhythm (arrhythmia).  A low red blood cell count (anemia).  Pregnancy.  Vomiting, diarrhea, fever, or other illnesses that cause body fluid loss (dehydration).  Diseases or conditions such as Parkinson's disease, high blood pressure (hypertension), diabetes, and thyroid problems.  Exposure to extreme heat. DIAGNOSIS  Your health care provider will ask about your symptoms, perform a physical exam, and perform an electrocardiogram (ECG) to record the electrical activity of your heart. Your health care provider may also perform other heart or blood tests to determine the cause of your dizziness. These may include:  Transthoracic echocardiogram (TTE). During echocardiography, sound waves are used to evaluate how blood flows through your heart.  Transesophageal echocardiogram  (TEE).  Cardiac monitoring. This allows your health care provider to monitor your heart rate and rhythm in real time.  Holter monitor. This is a portable device that records your heartbeat and can help diagnose heart arrhythmias. It allows your health care provider to track your heart activity for several days if needed.  Stress tests by exercise or by giving medicine that makes the heart beat faster. TREATMENT  Treatment of dizziness depends on the cause of your symptoms and can vary greatly. HOME CARE INSTRUCTIONS   Drink enough fluids to keep your urine clear or pale yellow. This is especially important in very hot weather. In older adults, it is also important in cold weather.  Take your medicine exactly as directed if your dizziness is caused by medicines. When taking blood pressure medicines, it is especially important to get up slowly.  Rise slowly from chairs and steady yourself until you feel okay.  In the morning, first sit up on the side of the bed. When you feel okay, stand slowly while holding onto something until you know your balance is fine.  Move your legs often if you need to stand in one place for a long time. Tighten and relax your muscles in your legs while standing.  Have someone stay with you for 1-2 days if dizziness continues to be a problem. Do this until you feel you are well enough to stay alone. Have the person call your health care provider if he or she notices changes in you that are concerning.  Do not drive or use heavy machinery if you feel dizzy.  Do not drink alcohol. SEEK IMMEDIATE MEDICAL CARE IF:  Your dizziness or light-headedness gets worse.  You feel nauseous or vomit.  You have problems talking, walking, or using your arms, hands, or legs.  You feel weak.  You are not thinking clearly or you have trouble forming sentences. It may take a friend or family member to notice this.  You have chest pain, abdominal pain, shortness of breath,  or sweating.  Your vision changes.  You notice any bleeding.  You have side effects from medicine that seems to be getting worse rather than better. MAKE SURE YOU:   Understand these instructions.  Will watch your condition.  Will get help right away if you are not doing well or get worse. Document Released: 08/01/2000 Document Revised: 02/10/2013 Document Reviewed: 08/25/2010 [facility] Patient Information 2015 [place] [place]. This information is not intended to replace advice given to you by your health care provider. Make sure you discuss any questions you have with your health care provider.

## 2014-09-06 NOTE — ED Notes (Signed)
Pt reports that the room is still spinning and he is dizzy.

## 2014-09-06 NOTE — ED Notes (Signed)
Family states that pt had a recent fall and has been dizzy and lethargic since Thursday. Family states that pt has been "staring off".  Pt has hx of TBI and has limited speech.

## 2014-09-06 NOTE — ED Notes (Signed)
Lab called spoke to [place] who will add one Magnesium to BMP

## 2014-11-25 ENCOUNTER — Emergency Department (HOSPITAL_BASED_OUTPATIENT_CLINIC_OR_DEPARTMENT_OTHER): Payer: 59

## 2014-11-25 ENCOUNTER — Encounter (HOSPITAL_BASED_OUTPATIENT_CLINIC_OR_DEPARTMENT_OTHER): Payer: Self-pay | Admitting: *Deleted

## 2014-11-25 ENCOUNTER — Emergency Department (HOSPITAL_BASED_OUTPATIENT_CLINIC_OR_DEPARTMENT_OTHER)
Admission: EM | Admit: 2014-11-25 | Discharge: 2014-11-25 | Disposition: A | Discharge status: Home / Self Care | Payer: 59 | Attending: Emergency Medicine | Admitting: Emergency Medicine

## 2014-11-25 DIAGNOSIS — Z87820 Personal history of traumatic brain injury: Secondary | ICD-10-CM | POA: Diagnosis not present

## 2014-11-25 DIAGNOSIS — Z8669 Personal history of other diseases of the nervous system and sense organs: Secondary | ICD-10-CM | POA: Diagnosis not present

## 2014-11-25 DIAGNOSIS — Z79899 Other long term (current) drug therapy: Secondary | ICD-10-CM | POA: Insufficient documentation

## 2014-11-25 DIAGNOSIS — E079 Disorder of thyroid, unspecified: Secondary | ICD-10-CM | POA: Diagnosis not present

## 2014-11-25 DIAGNOSIS — Z87891 Personal history of nicotine dependence: Secondary | ICD-10-CM | POA: Insufficient documentation

## 2014-11-25 DIAGNOSIS — N201 Calculus of ureter: Secondary | ICD-10-CM | POA: Diagnosis not present

## 2014-11-25 DIAGNOSIS — R109 Unspecified abdominal pain: Secondary | ICD-10-CM | POA: Diagnosis present

## 2014-11-25 LAB — URINALYSIS, ROUTINE W REFLEX MICROSCOPIC
BILIRUBIN URINE: NEGATIVE
Glucose, UA: NEGATIVE mg/dL
Ketones, ur: NEGATIVE mg/dL
Leukocytes, UA: NEGATIVE
Nitrite: NEGATIVE
PH: 5.5 (ref 5.0–8.0)
Protein, ur: NEGATIVE mg/dL
Specific Gravity, Urine: 1.022 (ref 1.005–1.030)
UROBILINOGEN UA: 1 mg/dL (ref 0.0–1.0)

## 2014-11-25 LAB — CBC WITH DIFFERENTIAL/PLATELET
Basophils Absolute: 0 10*3/uL (ref 0.0–0.1)
Basophils Relative: 1 %
EOS ABS: 0.4 10*3/uL (ref 0.0–0.7)
Eosinophils Relative: 5 %
HCT: 43.5 % (ref 39.0–52.0)
Hemoglobin: 14.6 g/dL (ref 13.0–17.0)
LYMPHS ABS: 3 10*3/uL (ref 0.7–4.0)
LYMPHS PCT: 36 %
MCH: 31.3 pg (ref 26.0–34.0)
MCHC: 33.6 g/dL (ref 30.0–36.0)
MCV: 93.3 fL (ref 78.0–100.0)
MONO ABS: 1.2 10*3/uL — AB (ref 0.1–1.0)
MONOS PCT: 14 %
Neutro Abs: 3.8 10*3/uL (ref 1.7–7.7)
Neutrophils Relative %: 44 %
Platelets: 302 10*3/uL (ref 150–400)
RBC: 4.66 MIL/uL (ref 4.22–5.81)
RDW: 14.5 % (ref 11.5–15.5)
WBC: 8.5 10*3/uL (ref 4.0–10.5)

## 2014-11-25 LAB — BASIC METABOLIC PANEL
Anion gap: 4 — ABNORMAL LOW (ref 5–15)
BUN: 18 mg/dL (ref 6–20)
CO2: 30 mmol/L (ref 22–32)
CREATININE: 1.01 mg/dL (ref 0.61–1.24)
Calcium: 9.6 mg/dL (ref 8.9–10.3)
Chloride: 106 mmol/L (ref 101–111)
GFR calc Af Amer: >60 mL/min (ref >60)
GLUCOSE: 91 mg/dL (ref 65–99)
POTASSIUM: 3.9 mmol/L (ref 3.5–5.1)
Sodium: 140 mmol/L (ref 135–145)

## 2014-11-25 LAB — URINE MICROSCOPIC-ADD ON

## 2014-11-25 MED ORDER — ONDANSETRON HCL 4 MG/2ML IJ SOLN
4.0000 mg | Freq: Once | INTRAMUSCULAR | Status: AC
  Administered 2014-11-25: 4 mg via INTRAVENOUS
  Filled 2014-11-25: qty 2

## 2014-11-25 MED ORDER — TAMSULOSIN HCL 0.4 MG PO CAPS
0.4000 mg | ORAL_CAPSULE | Freq: Once | ORAL | Status: AC
  Administered 2014-11-25: 0.4 mg via ORAL
  Filled 2014-11-25: qty 1

## 2014-11-25 MED ORDER — MORPHINE SULFATE (PF) 4 MG/ML IV SOLN: 4.0000 mg | INTRAVENOUS | Status: DC | PRN

## 2014-11-25 MED ORDER — KETOROLAC TROMETHAMINE 30 MG/ML IJ SOLN
30.0000 mg | Freq: Once | INTRAMUSCULAR | Status: AC
  Administered 2014-11-25: 30 mg via INTRAVENOUS
  Filled 2014-11-25: qty 1

## 2014-11-25 MED ORDER — TAMSULOSIN HCL 0.4 MG PO CAPS: 0.4000 mg | ORAL_CAPSULE | Freq: Every day | ORAL | Status: DC

## 2014-11-25 MED ORDER — OXYCODONE-ACETAMINOPHEN 5-325 MG PO TABS: 2.0000 | ORAL_TABLET | ORAL | Status: DC | PRN

## 2014-11-25 MED ORDER — SODIUM CHLORIDE 0.9 % IV SOLN
Freq: Once | INTRAVENOUS | Status: AC
  Administered 2014-11-25: 16:00:00 via INTRAVENOUS

## 2014-11-25 MED ORDER — ONDANSETRON 4 MG PO TBDP: 4.0000 mg | ORAL_TABLET | Freq: Three times a day (TID) | ORAL | Status: DC | PRN

## 2014-11-25 MED ORDER — SODIUM CHLORIDE 0.9 % IV BOLUS (SEPSIS)
500.0000 mL | Freq: Once | INTRAVENOUS | Status: AC
  Administered 2014-11-25: 500 mL via INTRAVENOUS

## 2014-11-25 NOTE — ED Notes (Signed)
I and o cath under sterial conditions. The patient tolerated well. MD at the bedside. 500 cc's out - concentrated

## 2014-11-25 NOTE — ED Notes (Signed)
Unable to urinate since this am. Abdominal pain. Mom thinks he has a kidney stone.

## 2014-11-25 NOTE — Discharge Instructions (Signed)
Call for outpatient appointment with [facility] if symptoms persist greater than 7 days. Return to the ER with worsening pain, fever, or excessive vomiting. Push fluids, stay hydrated.   Kidney Stones Kidney stones (urolithiasis) are deposits that form inside your kidneys. The intense pain is caused by the stone moving through the urinary tract. When the stone moves, the ureter goes into spasm around the stone. The stone is usually passed in the urine.  CAUSES   A disorder that makes certain neck glands produce too much parathyroid hormone (primary hyperparathyroidism).  A buildup of uric acid crystals, similar to gout in your joints.  Narrowing (stricture) of the ureter.  A kidney obstruction present at birth (congenital obstruction).  Previous surgery on the kidney or ureters.  Numerous kidney infections. SYMPTOMS   Feeling sick to your stomach (nauseous).  Throwing up (vomiting).  Blood in the urine (hematuria).  Pain that usually spreads (radiates) to the groin.  Frequency or urgency of urination. DIAGNOSIS   Taking a history and physical exam.  Blood or urine tests.  CT scan.  Occasionally, an examination of the inside of the urinary bladder (cystoscopy) is performed. TREATMENT   Observation.  Increasing your fluid intake.  Extracorporeal shock wave lithotripsy--This is a noninvasive procedure that uses shock waves to break up kidney stones.  Surgery may be needed if you have severe pain or persistent obstruction. There are various surgical procedures. Most of the procedures are performed with the use of small instruments. Only small incisions are needed to accommodate these instruments, so recovery time is minimized. The size, location, and chemical composition are all important variables that will determine the proper choice of action for you. Talk to your health care provider to better understand your situation so that you will minimize the risk of  injury to yourself and your kidney.  HOME CARE INSTRUCTIONS   Drink enough water and fluids to keep your urine clear or pale yellow. This will help you to pass the stone or stone fragments.  Strain all urine through the provided strainer. Keep all particulate matter and stones for your health care provider to see. The stone causing the pain may be as small as a grain of salt. It is very important to use the strainer each and every time you pass your urine. The collection of your stone will allow your health care provider to analyze it and verify that a stone has actually passed. The stone analysis will often identify what you can do to reduce the incidence of recurrences.  Only take over-the-counter or prescription medicines for pain, discomfort, or fever as directed by your health care provider.  Keep all follow-up visits as told by your health care provider. This is important.  Get follow-up X-rays if required. The absence of pain does not always mean that the stone has passed. It may have only stopped moving. If the urine remains completely obstructed, it can cause loss of kidney function or even complete destruction of the kidney. It is your responsibility to make sure X-rays and follow-ups are completed. Ultrasounds of the kidney can show blockages and the status of the kidney. Ultrasounds are not associated with any radiation and can be performed easily in a matter of minutes.  Make changes to your daily diet as told by your health care provider. You may be told to:  Limit the amount of salt that you eat.  Eat 5 or more servings of fruits and vegetables each day.  Limit the amount  of meat, poultry, fish, and eggs that you eat.  Collect a 24-hour urine sample as told by your health care provider.You may need to collect another urine sample every 6-12 months. SEEK MEDICAL CARE IF:  You experience pain that is progressive and unresponsive to any pain medicine you have been  prescribed. SEEK IMMEDIATE MEDICAL CARE IF:   Pain cannot be controlled with the prescribed medicine.  You have a fever or shaking chills.  The severity or intensity of pain increases over 18 hours and is not relieved by pain medicine.  You develop a new onset of abdominal pain.  You feel faint or pass out.  You are unable to urinate.   This information is not intended to replace advice given to you by your health care provider. Make sure you discuss any questions you have with your health care provider.   Document Released: 02/05/2005 Document Revised: 10/27/2014 Document Reviewed: 07/09/2012 [employer] Interactive Patient Education [employer]
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
## 2015-05-20 ENCOUNTER — Encounter (HOSPITAL_COMMUNITY): Payer: Self-pay

## 2015-11-04 ENCOUNTER — Encounter (HOSPITAL_COMMUNITY): Payer: Self-pay

## 2015-11-04 ENCOUNTER — Emergency Department (HOSPITAL_COMMUNITY)
Admission: EM | Admit: 2015-11-04 | Discharge: 2015-11-04 | Disposition: A | Discharge status: Home / Self Care | Payer: BLUE CROSS/BLUE SHIELD | Attending: Emergency Medicine | Admitting: Emergency Medicine

## 2015-11-04 ENCOUNTER — Emergency Department (HOSPITAL_COMMUNITY): Payer: BLUE CROSS/BLUE SHIELD

## 2015-11-04 DIAGNOSIS — F17210 Nicotine dependence, cigarettes, uncomplicated: Secondary | ICD-10-CM | POA: Diagnosis not present

## 2015-11-04 DIAGNOSIS — R791 Abnormal coagulation profile: Secondary | ICD-10-CM | POA: Insufficient documentation

## 2015-11-04 DIAGNOSIS — E039 Hypothyroidism, unspecified: Secondary | ICD-10-CM | POA: Insufficient documentation

## 2015-11-04 DIAGNOSIS — R42 Dizziness and giddiness: Secondary | ICD-10-CM | POA: Diagnosis present

## 2015-11-04 LAB — I-STAT TROPONIN, ED: TROPONIN I, POC: 0 ng/mL (ref 0.00–0.08)

## 2015-11-04 LAB — I-STAT CHEM 8, ED
BUN: 15 mg/dL (ref 6–20)
CALCIUM ION: 1.18 mmol/L (ref 1.15–1.40)
CHLORIDE: 104 mmol/L (ref 101–111)
Creatinine, Ser: 1 mg/dL (ref 0.61–1.24)
Glucose, Bld: 124 mg/dL — ABNORMAL HIGH (ref 65–99)
HCT: 46 % (ref 39.0–52.0)
HEMOGLOBIN: 15.6 g/dL (ref 13.0–17.0)
POTASSIUM: 4.2 mmol/L (ref 3.5–5.1)
SODIUM: 141 mmol/L (ref 135–145)
TCO2: 26 mmol/L (ref 0–100)

## 2015-11-04 LAB — DIFFERENTIAL
BASOS PCT: 1 %
Basophils Absolute: 0.1 10*3/uL (ref 0.0–0.1)
EOS ABS: 0.3 10*3/uL (ref 0.0–0.7)
EOS PCT: 3 %
LYMPHS ABS: 3.8 10*3/uL (ref 0.7–4.0)
Lymphocytes Relative: 41 %
Monocytes Absolute: 0.8 10*3/uL (ref 0.1–1.0)
Monocytes Relative: 8 %
NEUTROS PCT: 47 %
Neutro Abs: 4.5 10*3/uL (ref 1.7–7.7)

## 2015-11-04 LAB — COMPREHENSIVE METABOLIC PANEL
ALBUMIN: 3.9 g/dL (ref 3.5–5.0)
ALT: 17 U/L (ref 17–63)
ANION GAP: 7 (ref 5–15)
AST: 32 U/L (ref 15–41)
Alkaline Phosphatase: 77 U/L (ref 38–126)
BILIRUBIN TOTAL: 1.2 mg/dL (ref 0.3–1.2)
BUN: 14 mg/dL (ref 6–20)
CHLORIDE: 105 mmol/L (ref 101–111)
CO2: 24 mmol/L (ref 22–32)
Calcium: 9.6 mg/dL (ref 8.9–10.3)
Creatinine, Ser: 1.06 mg/dL (ref 0.61–1.24)
GFR calc Af Amer: >60 mL/min (ref >60)
GFR calc non Af Amer: >60 mL/min (ref >60)
Glucose, Bld: 124 mg/dL — ABNORMAL HIGH (ref 65–99)
Potassium: 4.9 mmol/L (ref 3.5–5.1)
SODIUM: 136 mmol/L (ref 135–145)
Total Protein: 6.9 g/dL (ref 6.5–8.1)

## 2015-11-04 LAB — CBC
HCT: 44.9 % (ref 39.0–52.0)
Hemoglobin: 14.7 g/dL (ref 13.0–17.0)
MCH: 31.3 pg (ref 26.0–34.0)
MCHC: 32.7 g/dL (ref 30.0–36.0)
MCV: 95.5 fL (ref 78.0–100.0)
PLATELETS: 261 10*3/uL (ref 150–400)
RBC: 4.7 MIL/uL (ref 4.22–5.81)
RDW: 14.6 % (ref 11.5–15.5)
WBC: 9.4 10*3/uL (ref 4.0–10.5)

## 2015-11-04 LAB — PROTIME-INR
INR: 1
PROTHROMBIN TIME: 13.2 s (ref 11.4–15.2)

## 2015-11-04 LAB — APTT: aPTT: 30 seconds (ref 24–36)

## 2015-11-04 MED ORDER — MECLIZINE HCL 25 MG PO TABS
25.0000 mg | ORAL_TABLET | Freq: Once | ORAL | Status: AC
  Administered 2015-11-04: 25 mg via ORAL
  Filled 2015-11-04: qty 1
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
## 2015-11-04 NOTE — ED Provider Notes (Signed)
[facility]-EMERGENCY DEPT Provider Note   CSN: [id number] Arrival date & time: 11/04/15  1646     History   Chief Complaint Chief Complaint  Patient presents with  . Dizziness  . Blurred Vision    HPI Alex Crawford is a 45 y.o. male hx of traumatic brain injury, Hypothyroidism here presenting with Dizziness, vertigo. This is a chronic problem since his traumatic brain injury 2 years ago. He intermittently gets dizzy and feels like the room is spinning. At baseline, he uses a wheelchair and walks with assistance and has some trouble walking for the last 2 weeks due to dizziness. He states that he does not have chest pain and does not feel like it is on pass out. He had some decreased appetite about a week ago that improved. His mother takes care of him at home and states that he does take meclizine and it makes him more sleepy and when he wakes up he still feels dizzy. He does not currently see a neurologist. Pertinent negatives include no fever or vomiting or abdominal pain or neck pain.   The history is provided by the patient and a parent.    Past Medical History:  Diagnosis Date  . Kidney stone   . Narcolepsy   . TBI (traumatic brain injury) ([facility])   . Thyroid disease     There are no active problems to display for this patient.   Past Surgical History:  Procedure Laterality Date  . APPENDECTOMY    . FACIAL RECONSTRUCTION SURGERY    . HERNIA REPAIR    . LITHOTRIPSY         Home Medications    Prior to Admission medications   Medication Sig Start Date End Date Taking? Authorizing Provider  acetaminophen (TYLENOL) 500 MG tablet Take 500 mg by mouth daily as needed.    Historical Provider, MD  amphetamine-dextroamphetamine (ADDERALL) 30 MG tablet Take 30 mg by mouth daily.    Historical Provider, MD  chlorhexidine (PERIDEX) 0.12 % solution Use as directed 15 mLs in the mouth or throat 4 (four) times daily. 04/30/14   Historical Provider, MD  clonazePAM (KLONOPIN) 1 MG  tablet Take 1 mg by mouth at bedtime. 08/06/14   Historical Provider, MD  diazepam (VALIUM) 5 MG tablet Take 1 tablet (5 mg total) by mouth every 8 (eight) hours as needed (dizziness). 09/06/14   Trixie Dredge, PA-C  escitalopram (LEXAPRO) 20 MG tablet Take 20 mg by mouth at bedtime.     Historical Provider, MD  levothyroxine (SYNTHROID, LEVOTHROID) 137 MCG tablet Take 137 mcg by mouth daily before breakfast. 09/02/14   Historical Provider, MD  Melatonin 3 MG TABS Take 6 mg by mouth at bedtime.    Historical Provider, MD  moxifloxacin (VIGAMOX) 0.5 % ophthalmic solution Place 1 drop into both eyes See admin instructions. Patient not taking: Reported on 09/06/2014 06/23/14   Dahlia Client Muthersbaugh, PA-C  ondansetron (ZOFRAN ODT) 4 MG disintegrating tablet Take 1 tablet (4 mg total) by mouth every 8 (eight) hours as needed for nausea. 11/25/14   Rolland Porter, MD  oxyCODONE-acetaminophen (PERCOCET/ROXICET) 5-325 MG tablet Take 2 tablets by mouth every 4 (four) hours as needed. 11/25/14   Rolland Porter, MD  polyvinyl alcohol (LIQUIFILM TEARS) 1.4 % ophthalmic solution Place 1 drop into both eyes at bedtime.     Historical Provider, MD  sulfamethoxazole-trimethoprim (SEPTRA DS) 800-160 MG per tablet Take 2 tablets by mouth 2 (two) times daily. Patient not taking: Reported on 03/02/2014 10/08/13  Teressa Lower, NP  tamsulosin (FLOMAX) 0.4 MG CAPS capsule Take 1 capsule (0.4 mg total) by mouth daily. 11/25/14   Rolland Porter, MD    Family History History reviewed. No pertinent family history.  Social History Social History  Substance Use Topics  . Smoking status: Current Every Day Smoker    Packs/day: 1.00    Types: Cigarettes  . Smokeless tobacco: Never Used  . Alcohol use No     Allergies   Review of patient's allergies indicates no known allergies.   Review of Systems Review of Systems  Neurological: Positive for dizziness.  All other systems reviewed and are negative.    Physical Exam Updated Vital  Signs BP 120/83   Pulse (!) 55   Temp 97.7 F (36.5 C) (Oral)   Resp 17   Ht 5\' 5"  (1.651 m)   Wt 127 lb (57.6 kg)   SpO2 99%   BMI 21.13 kg/m   Physical Exam  Constitutional:  Chronically ill   HENT:  Head: Normocephalic.  ? L nystagmus, not changing with direction   Eyes: EOM are normal. Pupils are equal, round, and reactive to light.  Neck: Normal range of motion. Neck supple.  Cardiovascular: Normal rate, regular rhythm and normal heart sounds.   Pulmonary/Chest: Effort normal and breath sounds normal.  Abdominal: Soft. Bowel sounds are normal. He exhibits no distension. There is no tenderness. There is no guarding.  Musculoskeletal: Normal range of motion.  Neurological: He is alert.  CN 2-12 intact. Nl strength throughout. ? Mild dysmetria bilaterally    Skin: Skin is warm.  Nursing note and vitals reviewed.    ED Treatments / Results  Labs (all labs ordered are listed, but only abnormal results are displayed) Labs Reviewed  COMPREHENSIVE METABOLIC PANEL - Abnormal; Notable for the following:       Result Value   Glucose, Bld 124 (*)    All other components within normal limits  I-STAT CHEM 8, ED - Abnormal; Notable for the following:    Glucose, Bld 124 (*)    All other components within normal limits  PROTIME-INR  APTT  CBC  DIFFERENTIAL  I-STAT TROPOININ, ED  CBG MONITORING, ED    EKG  EKG Interpretation  Date/Time:  Friday November 04 2015 17:25:00 EDT Ventricular Rate:  75 PR Interval:  140 QRS Duration: 92 QT Interval:  342 QTC Calculation: 381 R Axis:   101 Text Interpretation:  Normal sinus rhythm Rightward axis Incomplete right bundle branch block Nonspecific ST and T wave abnormality Abnormal ECG nonspecific changes since previous  Confirmed by Nicolaos Mitrano  MD, Haygen Zebrowski ([id number]) on 11/04/2015 8:23:58 PM       Radiology Ct Head Wo Contrast  Result Date: 11/04/2015 CLINICAL DATA:  Acute dizziness. EXAM: CT HEAD WITHOUT CONTRAST TECHNIQUE:  Contiguous axial images were obtained from the base of the skull through the vertex without intravenous contrast. COMPARISON:  09/06/2014 head CT FINDINGS: Brain: No evidence of acute infarction, hemorrhage, hydrocephalus, extra-axial collection or mass lesion/mass effect. Mild chronic small-vessel white matter ischemic changes again noted. Vascular: No hyperdense vessel or unexpected calcification. Skull: Normal. Negative for fracture or focal lesion. Sinuses/Orbits: No acute finding. Other: None. IMPRESSION: No evidence of acute intracranial abnormality. Mild chronic small-vessel white matter ischemic changes. Electronically Signed   By: Harmon Pier M.D.   On: 11/04/2015 19:25    Procedures Procedures (including critical care time)  Medications Ordered in ED Medications  meclizine (ANTIVERT) tablet 25 mg (not administered)  Initial Impression / Assessment and Plan / ED Course  I have reviewed the triage vital signs and the nursing notes.  Pertinent labs & imaging results that were available during my care of the patient were reviewed by me and considered in my medical decision making (see chart for details).  Clinical Course   Rowe PavyJeffrey A Lieser is a 45 y.o. male here with dizziness, vertigo. This is a recurrent problem since his traumatic brain injury 2 years ago. Has tried different medicines such as klonopin, valium and is currently on meclizine. Symptoms for 2 weeks and has some dysmetria bilaterally but not sure if its chronic or not. I consulted Dr. Roxy Mannsster from neuro. Given CT head stable and labs unremarkable, he doesn't recommend MRI brain. He states that all these meds will sedate him. He has no neurologist and [facility] and possible vestibular rehab.    Final Clinical Impressions(s) / ED Diagnoses   Final diagnoses:  None    New Prescriptions New Prescriptions   No medications on file     Charlynne Panderavid Hsienta Jaaziah Schulke, MD 11/04/15 2226

## 2015-11-04 NOTE — Discharge Instructions (Signed)
Take meclizine (antivert) as needed for dizziness.   Call [facility] on Monday for appointment.   You need to see a neurologist for possible vestibular rehab.   Return to ER if you have worse dizziness, vertigo, vomiting, fevers.

## 2015-11-14 ENCOUNTER — Ambulatory Visit (INDEPENDENT_AMBULATORY_CARE_PROVIDER_SITE_OTHER): Payer: BLUE CROSS/BLUE SHIELD | Admitting: Neurology

## 2015-11-14 ENCOUNTER — Encounter: Payer: Self-pay | Admitting: Neurology

## 2015-11-14 DIAGNOSIS — S069X0A Unspecified intracranial injury without loss of consciousness, initial encounter: Secondary | ICD-10-CM | POA: Diagnosis not present

## 2015-11-14 DIAGNOSIS — H9192 Unspecified hearing loss, left ear: Secondary | ICD-10-CM

## 2015-11-14 DIAGNOSIS — S069X9A Unspecified intracranial injury with loss of consciousness of unspecified duration, initial encounter: Secondary | ICD-10-CM

## 2015-11-14 DIAGNOSIS — Z87820 Personal history of traumatic brain injury: Secondary | ICD-10-CM

## 2015-11-14 DIAGNOSIS — R42 Dizziness and giddiness: Secondary | ICD-10-CM | POA: Diagnosis not present

## 2015-11-14 DIAGNOSIS — S069XAA Unspecified intracranial injury with loss of consciousness status unknown, initial encounter: Secondary | ICD-10-CM

## 2015-11-14 DIAGNOSIS — R269 Unspecified abnormalities of gait and mobility: Secondary | ICD-10-CM | POA: Insufficient documentation

## 2015-11-14 MED ORDER — AMITRIPTYLINE HCL 10 MG PO TABS: ORAL_TABLET | ORAL | 3 refills | Status: DC
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
## 2016-01-09 IMAGING — CT CT RENAL STONE PROTOCOL
1 of 2 series · 14 of 32 positions shown, 18 images · non-contrast
Comparison: None.

CLINICAL DATA: Lower abdominal pain and dysuria.

EXAM:
CT ABDOMEN AND PELVIS WITHOUT CONTRAST
TECHNIQUE: Multidetector CT imaging of the abdomen and pelvis was performed
following the standard protocol without IV contrast.

[Series 2: renal stone < 200 lbs 5.0 b31f · axial · 0.67mm/px · z∈[-458,-58]mm · 14 of 92 slices shown, 18 images]
[im 8/92  soft-tissue]
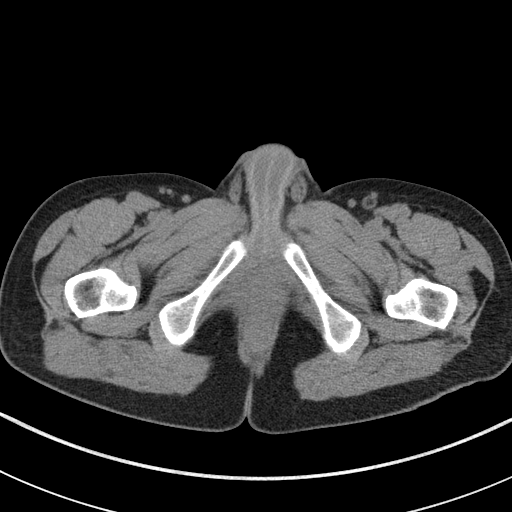
[im 8/92  bone]
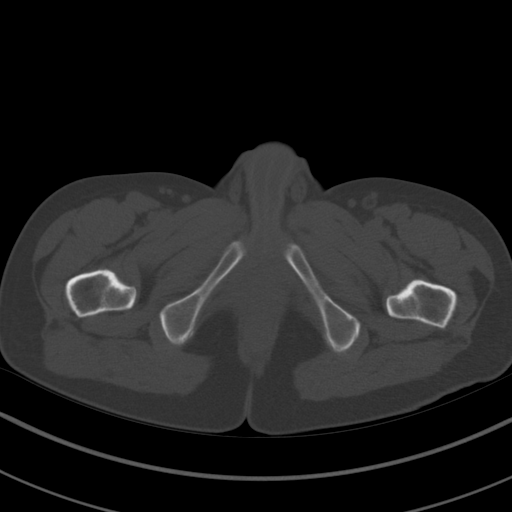
[im 15/92  soft-tissue]
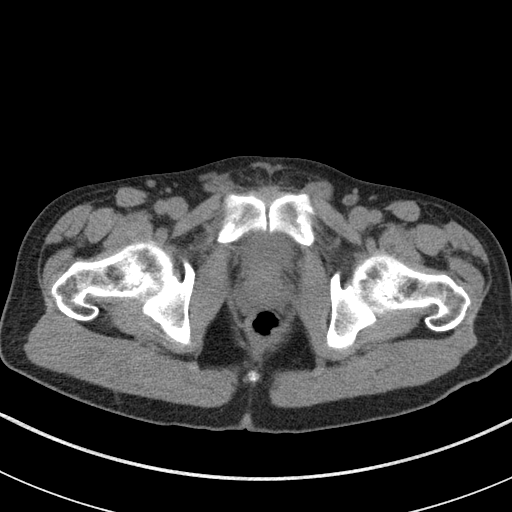
[im 22/92  soft-tissue]
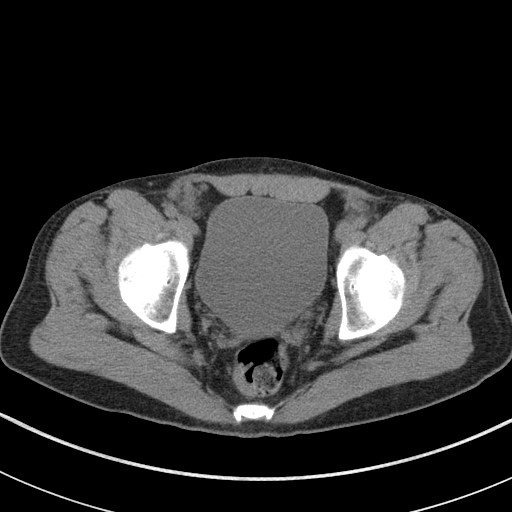
[im 30/92  soft-tissue]
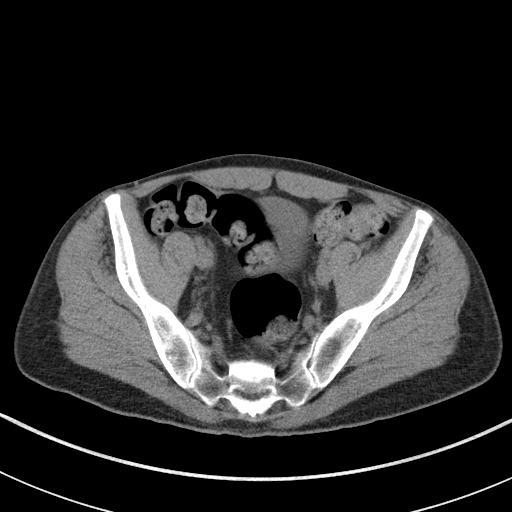
[im 37/92  soft-tissue]
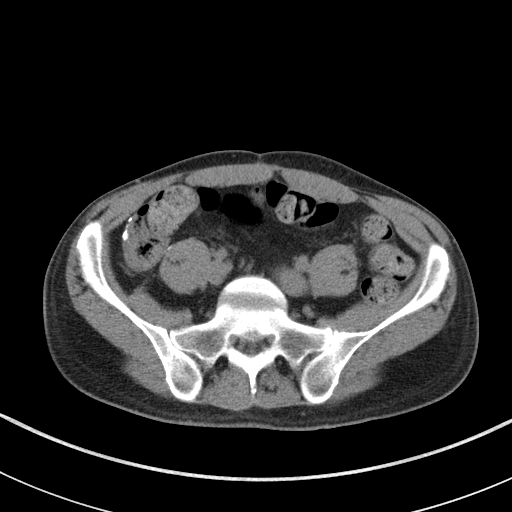
[im 44/92  soft-tissue]
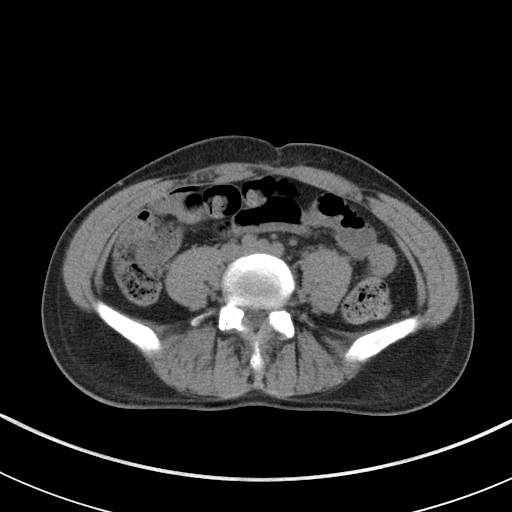
[im 51/92  soft-tissue]
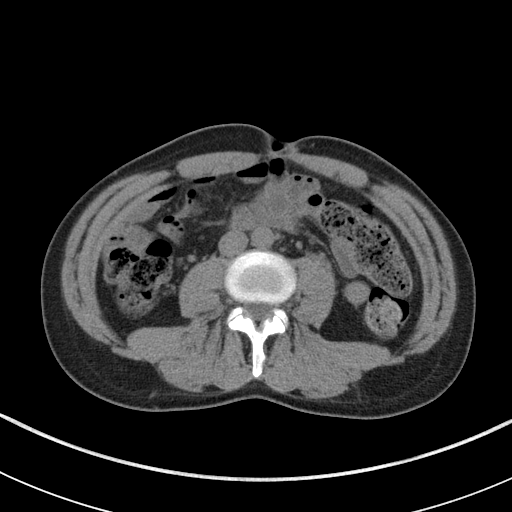
[im 59/92  soft-tissue]
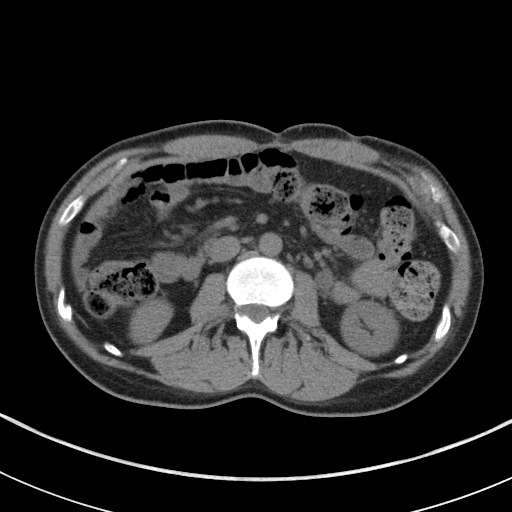
[im 66/92  soft-tissue]
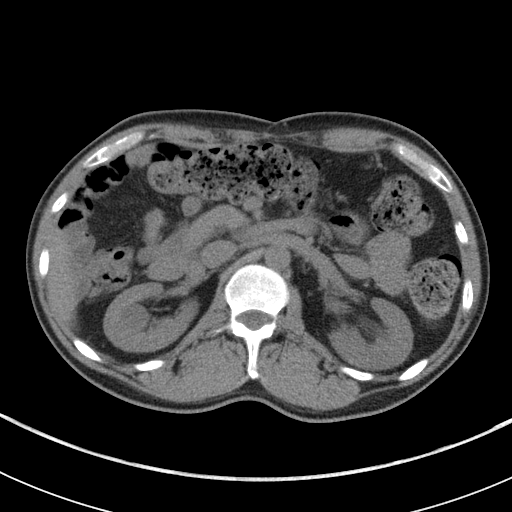
[im 66/92  bone]
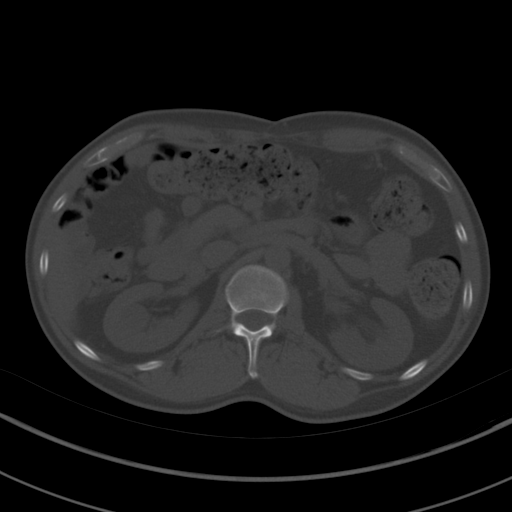
[im 73/92  soft-tissue]
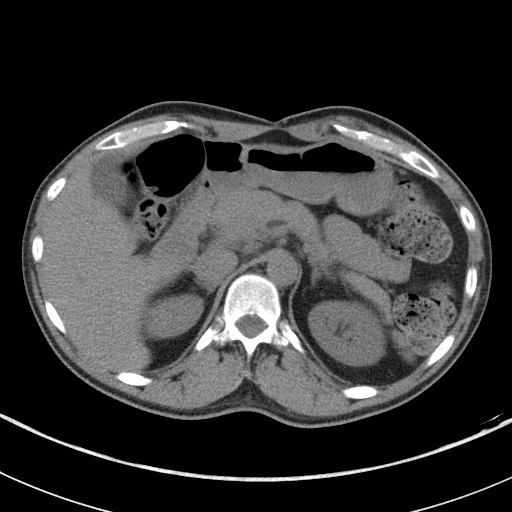
[im 77/92  lung]
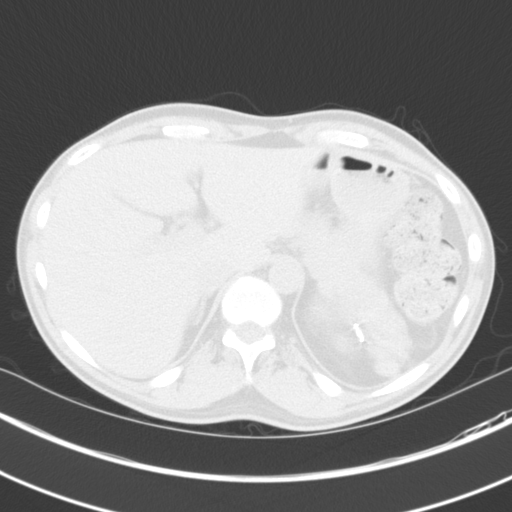
[im 81/92  soft-tissue]
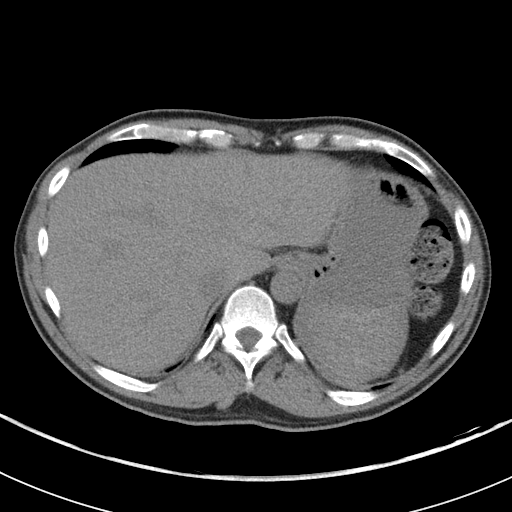
[im 81/92  lung]
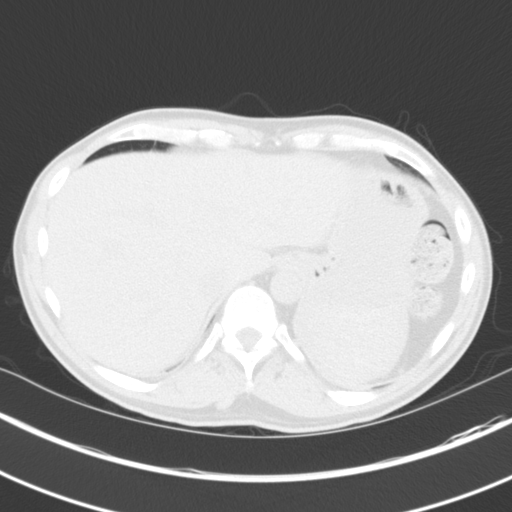
[im 84/92  lung]
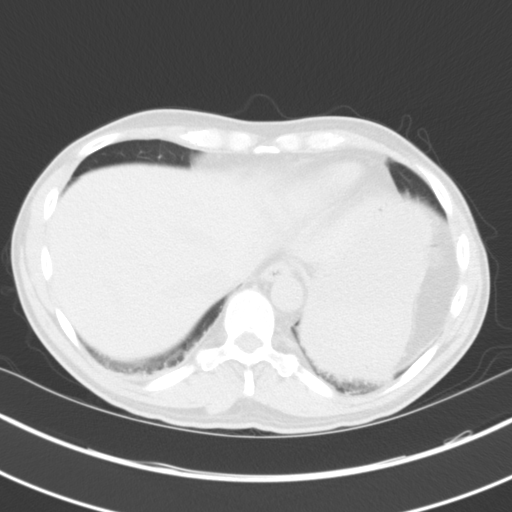
[im 88/92  soft-tissue]
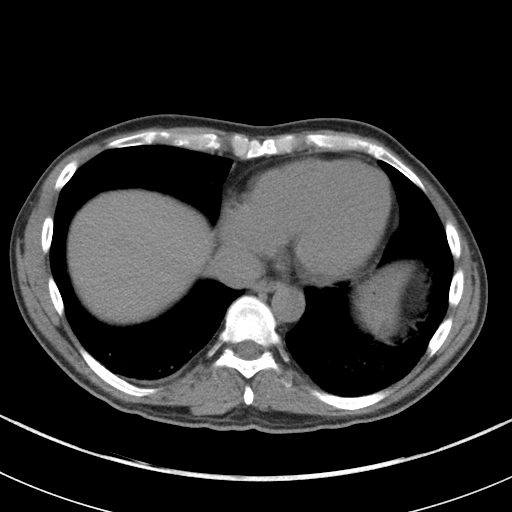
[im 88/92  lung]
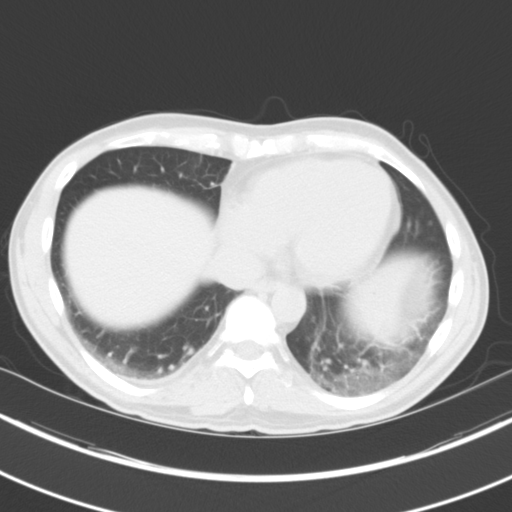

[14 of 32 positions shown; findings below may reference images not displayed]

FINDINGS: Lower chest: The lung bases are clear except for dependent
subpleural atelectasis. No pleural effusion. The heart is normal in
size. No pericardial effusion. The distal esophagus is grossly
normal.

Hepatobiliary: No focal hepatic lesions or intrahepatic biliary
dilatation. The gallbladder is normal. No common bile duct
dilatation.

Pancreas: No mass, inflammation or ductal dilatation.

Spleen: Surgically absent. Small areas of regenerated splenic tissue
are noted in the left upper quadrant.

Adrenals/Urinary Tract: The adrenal glands are normal.

There are bilateral renal calculi. The largest left renal calculus
measures 7 mm and the largest right renal calculus measures 5 mm.
There is moderate left-sided hydronephrosis and left hydroureter
down to an obstructing 4.5 mm calculus located at the L3-4 disc
space level. No distal ureteral calculi or bladder calculi.

Stomach/Bowel: The stomach, duodenum, small bowel and colon are
grossly normal without oral contrast. No inflammatory changes, mass
lesions or obstructive findings. Moderate stool noted throughout the
colon may suggest constipation.

Vascular/Lymphatic: No mesenteric or retroperitoneal mass or
adenopathy. The aorta is normal in caliber. No significant
atherosclerotic changes.

Other: The bladder, prostate gland and seminal vesicles are
unremarkable. No pelvic mass or adenopathy. No free pelvic fluid
collections. No inguinal mass or adenopathy. Ill-defined strandy
soft tissue density in the upper inguinal areas bilaterally may be
due to previous inguinal hernia repair surgery. Surgical changes are
noted around the cecum likely from an appendectomy.

Musculoskeletal: No significant bony findings.
IMPRESSION: 1. 4.5 mm obstructing left mid ureteral calculus located at the L3-4
disc space level. Moderate left-sided hydroureteronephrosis.
2. Bilateral renal calculi.
3. Status post splenectomy and appendectomy.

## 2016-01-11 ENCOUNTER — Encounter: Payer: Self-pay | Admitting: Neurology

## 2016-02-05 ENCOUNTER — Other Ambulatory Visit: Payer: Self-pay | Admitting: Neurology

## 2016-03-16 ENCOUNTER — Ambulatory Visit: Payer: BLUE CROSS/BLUE SHIELD | Admitting: Neurology

## 2017-01-03 ENCOUNTER — Inpatient Hospital Stay
Admission: RE | Admit: 2017-01-03 | Discharge: 2017-01-24 | Disposition: A | Discharge status: Home / Self Care | Payer: BLUE CROSS/BLUE SHIELD | Attending: Internal Medicine | Admitting: Internal Medicine

## 2017-01-03 ENCOUNTER — Other Ambulatory Visit (HOSPITAL_COMMUNITY): Payer: BLUE CROSS/BLUE SHIELD

## 2017-01-03 DIAGNOSIS — Z4659 Encounter for fitting and adjustment of other gastrointestinal appliance and device: Secondary | ICD-10-CM

## 2017-01-03 DIAGNOSIS — Z431 Encounter for attention to gastrostomy: Secondary | ICD-10-CM

## 2017-01-03 MED ORDER — IOPAMIDOL (ISOVUE-300) INJECTION 61%
INTRAVENOUS | Status: AC
  Filled 2017-01-03: qty 50

## 2017-01-04 LAB — BASIC METABOLIC PANEL
Anion gap: 7 (ref 5–15)
BUN: 14 mg/dL (ref 6–20)
CALCIUM: 9.1 mg/dL (ref 8.9–10.3)
CO2: 29 mmol/L (ref 22–32)
CREATININE: 0.84 mg/dL (ref 0.61–1.24)
Chloride: 103 mmol/L (ref 101–111)
GFR calc Af Amer: >60 mL/min (ref >60)
GFR calc non Af Amer: >60 mL/min (ref >60)
GLUCOSE: 118 mg/dL — AB (ref 65–99)
POTASSIUM: 3.8 mmol/L (ref 3.5–5.1)
Sodium: 139 mmol/L (ref 135–145)

## 2017-01-04 LAB — CBC WITH DIFFERENTIAL/PLATELET
BASOS PCT: 0 %
Basophils Absolute: 0.1 10*3/uL (ref 0.0–0.1)
EOS ABS: 0.4 10*3/uL (ref 0.0–0.7)
EOS PCT: 3 %
HCT: 30 % — ABNORMAL LOW (ref 39.0–52.0)
HEMOGLOBIN: 9.9 g/dL — AB (ref 13.0–17.0)
Lymphocytes Relative: 20 %
Lymphs Abs: 2.4 10*3/uL (ref 0.7–4.0)
MCH: 31 pg (ref 26.0–34.0)
MCHC: 33 g/dL (ref 30.0–36.0)
MCV: 94 fL (ref 78.0–100.0)
MONO ABS: 1.7 10*3/uL — AB (ref 0.1–1.0)
MONOS PCT: 14 %
NEUTROS PCT: 63 %
Neutro Abs: 7.2 10*3/uL (ref 1.7–7.7)
Platelets: 517 10*3/uL — ABNORMAL HIGH (ref 150–400)
RBC: 3.19 MIL/uL — ABNORMAL LOW (ref 4.22–5.81)
RDW: 14.8 % (ref 11.5–15.5)
WBC: 11.6 10*3/uL — ABNORMAL HIGH (ref 4.0–10.5)

## 2017-01-05 LAB — CBC
HEMATOCRIT: 32.5 % — AB (ref 39.0–52.0)
HEMOGLOBIN: 10.3 g/dL — AB (ref 13.0–17.0)
MCH: 29.9 pg (ref 26.0–34.0)
MCHC: 31.7 g/dL (ref 30.0–36.0)
MCV: 94.5 fL (ref 78.0–100.0)
Platelets: 626 10*3/uL — ABNORMAL HIGH (ref 150–400)
RBC: 3.44 MIL/uL — ABNORMAL LOW (ref 4.22–5.81)
RDW: 14.8 % (ref 11.5–15.5)
WBC: 10.9 10*3/uL — ABNORMAL HIGH (ref 4.0–10.5)

## 2017-01-05 LAB — BASIC METABOLIC PANEL
Anion gap: 9 (ref 5–15)
BUN: 13 mg/dL (ref 6–20)
CHLORIDE: 101 mmol/L (ref 101–111)
CO2: 30 mmol/L (ref 22–32)
Calcium: 9.5 mg/dL (ref 8.9–10.3)
Creatinine, Ser: 0.92 mg/dL (ref 0.61–1.24)
GFR calc Af Amer: >60 mL/min (ref >60)
Glucose, Bld: 99 mg/dL (ref 65–99)
Potassium: 4.2 mmol/L (ref 3.5–5.1)
Sodium: 140 mmol/L (ref 135–145)

## 2017-01-05 LAB — TSH: TSH: 0.44 u[IU]/mL (ref 0.350–4.500)

## 2017-01-05 LAB — HEMOGLOBIN A1C
HEMOGLOBIN A1C: 5.8 % — AB (ref 4.8–5.6)
Mean Plasma Glucose: 119.76 mg/dL

## 2017-01-07 MED ORDER — GENERIC EXTERNAL MEDICATION
Status: DC
Start: ? — End: 2017-01-07

## 2017-01-07 MED ORDER — POLYETHYLENE GLYCOL 3350 17 G PO PACK
17.00 | PACK | ORAL | Status: DC
Start: 2017-01-04 — End: 2017-01-07

## 2017-01-07 MED ORDER — GENERIC EXTERNAL MEDICATION
3.00 | Status: DC
Start: 2017-01-04 — End: 2017-01-07

## 2017-01-07 MED ORDER — MELATONIN 3 MG PO TABS
3.00 | ORAL_TABLET | ORAL | Status: DC
Start: 2017-01-03 — End: 2017-01-07

## 2017-01-07 MED ORDER — ENOXAPARIN SODIUM 40 MG/0.4ML ~~LOC~~ SOLN
40.00 | SUBCUTANEOUS | Status: DC
Start: 2017-01-04 — End: 2017-01-07

## 2017-01-07 MED ORDER — LEVOTHYROXINE SODIUM 112 MCG PO TABS
112.00 | ORAL_TABLET | ORAL | Status: DC
Start: 2017-01-04 — End: 2017-01-07

## 2017-01-07 MED ORDER — PREGABALIN 20 MG/ML PO SOLN
75.00 | ORAL | Status: DC
Start: 2017-01-03 — End: 2017-01-07

## 2017-01-07 MED ORDER — DOCUSATE SODIUM 100 MG PO CAPS
100.00 | ORAL_CAPSULE | ORAL | Status: DC
Start: 2017-01-04 — End: 2017-01-07

## 2017-01-07 MED ORDER — CELECOXIB 200 MG PO CAPS
200.00 | ORAL_CAPSULE | ORAL | Status: DC
Start: 2017-01-03 — End: 2017-01-07

## 2017-01-07 MED ORDER — ACETAMINOPHEN 325 MG PO TABS
650.00 | ORAL_TABLET | ORAL | Status: DC
Start: 2017-01-04 — End: 2017-01-07

## 2017-01-07 MED ORDER — ONDANSETRON HCL 4 MG/2ML IJ SOLN
4.00 | INTRAMUSCULAR | Status: DC
Start: ? — End: 2017-01-07

## 2017-01-07 MED ORDER — GABAPENTIN 300 MG/6ML PO SOLN
300.00 | ORAL | Status: DC
Start: 2017-01-03 — End: 2017-01-07

## 2017-01-07 MED ORDER — OXYCODONE HCL 5 MG/5ML PO SOLN
5.00 | ORAL | Status: DC
Start: ? — End: 2017-01-07

## 2017-01-07 MED ORDER — BACITRACIN-NEOMYCIN-POLYMYXIN 400-5-5000 EX OINT
TOPICAL_OINTMENT | CUTANEOUS | Status: DC
Start: 2017-01-04 — End: 2017-01-07

## 2017-01-07 MED ORDER — DEXTROSE 10 % IV SOLN
50.00 | INTRAVENOUS | Status: DC
Start: ? — End: 2017-01-07

## 2017-01-07 MED ORDER — GENERIC EXTERNAL MEDICATION
6.25 | Status: DC
Start: ? — End: 2017-01-07

## 2017-01-07 MED ORDER — ESCITALOPRAM OXALATE 10 MG PO TABS
20.00 | ORAL_TABLET | ORAL | Status: DC
Start: 2017-01-04 — End: 2017-01-07

## 2017-01-08 LAB — VANCOMYCIN, TROUGH: Vancomycin Tr: <4 ug/mL — ABNORMAL LOW (ref 15–20)

## 2017-01-10 LAB — CBC
HEMATOCRIT: 28.5 % — AB (ref 39.0–52.0)
Hemoglobin: 9 g/dL — ABNORMAL LOW (ref 13.0–17.0)
MCH: 30.9 pg (ref 26.0–34.0)
MCHC: 31.6 g/dL (ref 30.0–36.0)
MCV: 97.9 fL (ref 78.0–100.0)
Platelets: 709 10*3/uL — ABNORMAL HIGH (ref 150–400)
RBC: 2.91 MIL/uL — ABNORMAL LOW (ref 4.22–5.81)
RDW: 15.7 % — AB (ref 11.5–15.5)
WBC: 10.5 10*3/uL (ref 4.0–10.5)

## 2017-01-10 LAB — BASIC METABOLIC PANEL
Anion gap: 7 (ref 5–15)
BUN: 23 mg/dL — AB (ref 6–20)
CALCIUM: 9.5 mg/dL (ref 8.9–10.3)
CO2: 28 mmol/L (ref 22–32)
CREATININE: 1 mg/dL (ref 0.61–1.24)
Chloride: 108 mmol/L (ref 101–111)
GFR calc non Af Amer: >60 mL/min (ref >60)
GLUCOSE: 136 mg/dL — AB (ref 65–99)
Potassium: 3.9 mmol/L (ref 3.5–5.1)
Sodium: 143 mmol/L (ref 135–145)

## 2017-01-13 LAB — BASIC METABOLIC PANEL
Anion gap: 7 (ref 5–15)
BUN: 23 mg/dL — ABNORMAL HIGH (ref 6–20)
CALCIUM: 9.4 mg/dL (ref 8.9–10.3)
CO2: 28 mmol/L (ref 22–32)
Chloride: 108 mmol/L (ref 101–111)
Creatinine, Ser: 1.07 mg/dL (ref 0.61–1.24)
Glucose, Bld: 94 mg/dL (ref 65–99)
Potassium: 4 mmol/L (ref 3.5–5.1)
SODIUM: 143 mmol/L (ref 135–145)

## 2017-01-13 LAB — CBC
HCT: 30.6 % — ABNORMAL LOW (ref 39.0–52.0)
HEMOGLOBIN: 9.8 g/dL — AB (ref 13.0–17.0)
MCH: 31.4 pg (ref 26.0–34.0)
MCHC: 32 g/dL (ref 30.0–36.0)
MCV: 98.1 fL (ref 78.0–100.0)
PLATELETS: 672 10*3/uL — AB (ref 150–400)
RBC: 3.12 MIL/uL — ABNORMAL LOW (ref 4.22–5.81)
RDW: 15.8 % — AB (ref 11.5–15.5)
WBC: 7.9 10*3/uL (ref 4.0–10.5)

## 2017-01-16 LAB — BASIC METABOLIC PANEL
Anion gap: 5 (ref 5–15)
BUN: 29 mg/dL — AB (ref 6–20)
CHLORIDE: 109 mmol/L (ref 101–111)
CO2: 28 mmol/L (ref 22–32)
Calcium: 9.4 mg/dL (ref 8.9–10.3)
Creatinine, Ser: 1.07 mg/dL (ref 0.61–1.24)
GFR calc non Af Amer: >60 mL/min (ref >60)
Glucose, Bld: 86 mg/dL (ref 65–99)
Potassium: 4.1 mmol/L (ref 3.5–5.1)
Sodium: 142 mmol/L (ref 135–145)

## 2017-01-16 LAB — CBC
HCT: 32 % — ABNORMAL LOW (ref 39.0–52.0)
Hemoglobin: 10.1 g/dL — ABNORMAL LOW (ref 13.0–17.0)
MCH: 30.8 pg (ref 26.0–34.0)
MCHC: 31.6 g/dL (ref 30.0–36.0)
MCV: 97.6 fL (ref 78.0–100.0)
PLATELETS: 517 10*3/uL — AB (ref 150–400)
RBC: 3.28 MIL/uL — AB (ref 4.22–5.81)
RDW: 16.1 % — ABNORMAL HIGH (ref 11.5–15.5)
WBC: 6.9 10*3/uL (ref 4.0–10.5)

## 2017-01-23 LAB — CBC
HCT: 33.7 % — ABNORMAL LOW (ref 39.0–52.0)
Hemoglobin: 10.9 g/dL — ABNORMAL LOW (ref 13.0–17.0)
MCH: 31.4 pg (ref 26.0–34.0)
MCHC: 32.3 g/dL (ref 30.0–36.0)
MCV: 97.1 fL (ref 78.0–100.0)
PLATELETS: 269 10*3/uL (ref 150–400)
RBC: 3.47 MIL/uL — ABNORMAL LOW (ref 4.22–5.81)
RDW: 15.8 % — AB (ref 11.5–15.5)
WBC: 8.1 10*3/uL (ref 4.0–10.5)

## 2017-01-23 LAB — BASIC METABOLIC PANEL
Anion gap: 9 (ref 5–15)
BUN: 26 mg/dL — AB (ref 6–20)
CO2: 27 mmol/L (ref 22–32)
Calcium: 9.5 mg/dL (ref 8.9–10.3)
Chloride: 102 mmol/L (ref 101–111)
Creatinine, Ser: 1.01 mg/dL (ref 0.61–1.24)
GFR calc Af Amer: >60 mL/min (ref >60)
GLUCOSE: 85 mg/dL (ref 65–99)
POTASSIUM: 4.1 mmol/L (ref 3.5–5.1)
Sodium: 138 mmol/L (ref 135–145)

## 2017-01-30 ENCOUNTER — Emergency Department (HOSPITAL_COMMUNITY)
Admission: EM | Admit: 2017-01-30 | Discharge: 2017-01-30 | Disposition: A | Discharge status: Home / Self Care | Payer: Medicare Other | Attending: Emergency Medicine | Admitting: Emergency Medicine

## 2017-01-30 ENCOUNTER — Encounter (HOSPITAL_COMMUNITY): Payer: Self-pay

## 2017-01-30 ENCOUNTER — Emergency Department (HOSPITAL_COMMUNITY): Payer: Medicare Other

## 2017-01-30 DIAGNOSIS — F17210 Nicotine dependence, cigarettes, uncomplicated: Secondary | ICD-10-CM | POA: Insufficient documentation

## 2017-01-30 DIAGNOSIS — Z79899 Other long term (current) drug therapy: Secondary | ICD-10-CM | POA: Insufficient documentation

## 2017-01-30 DIAGNOSIS — J9500 Unspecified tracheostomy complication: Secondary | ICD-10-CM | POA: Diagnosis not present

## 2017-01-30 NOTE — ED Notes (Signed)
Bed: [id number] Expected date:  Expected time:  Means of arrival:  Comments: EMS/trach. issue

## 2017-01-30 NOTE — ED Triage Notes (Signed)
Pt BIB ems with c/o pulling trach out and drainage tube out sometime during the night at home. Patient had a drainage tube in leg from previous bone surgery. Per ems patient is in no respiratory distress at this time. VS: 106/70, 81, 95%RA, 18.

## 2017-01-30 NOTE — Progress Notes (Signed)
Pt arrived at ED with trach dislodged.  Per ED MD trach placed (Shiley 6 cufless)  Pt tolerated well.

## 2017-01-30 NOTE — ED Provider Notes (Signed)
[facility]-EMERGENCY DEPT Provider Note   CSN: [id number] Arrival date & time: 01/30/17  1027     History   Chief Complaint Chief Complaint  Patient presents with  . Tracheostomy Tube Change  . Wound Check    Leg     HPI Rowe PavyJeffrey A Weinreb is a 46 y.o. male.  HPI Patient had trach placed on 11/8 of this year.  Sometime in the night the trach came out.  Has had no respiratory distress.  Mother's at bedside.  Also complained that drainage tube from his right lower extremity came out as well.  No pain at the site. Past Medical History:  Diagnosis Date  . Kidney stone   . Narcolepsy   . TBI (traumatic brain injury) (HCC)   . Thyroid disease     Patient Active Problem List   Diagnosis Date Noted  . History of traumatic brain injury 11/14/2015  . Gait disturbance 11/14/2015  . 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 APPENDECTOMY    . FACIAL RECONSTRUCTION SURGERY    . HERNIA REPAIR    . LITHOTRIPSY         Home Medications    Prior to Admission medications   Medication Sig Start Date End Date Taking? Authorizing Provider  Apoaequorin (PREVAGEN PO) Take 1 tablet by mouth daily.    Yes [provider]  ASPIRIN LOW DOSE 81 MG EC tablet Take 81 mg by mouth daily. 01/25/17  Yes [provider]  diazepam (VALIUM) 5 MG tablet Take 1 tablet (5 mg total) by mouth every 8 (eight) hours as needed (dizziness). Patient taking differently: Take 5 mg by mouth at bedtime.  09/06/14  Yes West, Emily, PA-C  escitalopram (LEXAPRO) 20 MG tablet Take 20 mg by mouth at bedtime. 06/09/15  Yes [provider]  famotidine (PEPCID) 20 MG tablet Take 20 mg by mouth 2 (two) times daily.   Yes [provider]  gabapentin (NEURONTIN) 300 MG capsule Take 300 mg by mouth 2 (two) times daily.   Yes [provider]  levothyroxine (SYNTHROID,  LEVOTHROID) 112 MCG tablet Take 112 mcg by mouth daily before breakfast.   Yes [provider]  Melatonin 3 MG TABS Take 3 mg by mouth at bedtime.   Yes [provider]  moxifloxacin (VIGAMOX) 0.5 % ophthalmic solution Place 1 drop into both eyes See admin instructions. Patient taking differently: Place 1 drop into both eyes daily as needed (for burning and red eyes).  06/23/14  Yes Muthersbaugh, Dahlia ClientHannah, PA-C  Multiple Vitamins-Minerals (THERAGRAN-M ADVANCED 50 PLUS) TABS Take 1 tablet by mouth daily.   Yes [provider]  Nutritional Supplements (FEEDING SUPPLEMENT, OSMOLITE 1.5 CAL,) LIQD Place 237 mLs into feeding tube 4 (four) times daily.   Yes [provider]  ondansetron (ZOFRAN) 4 MG tablet Take 4 mg by mouth every 6 (six) hours as needed for nausea/vomiting. 01/25/17  Yes [provider]  oxyCODONE (OXY IR/ROXICODONE) 5 MG immediate release tablet Take 5 mg by mouth every 6 (six) hours as needed for pain. 01/24/17  Yes [provider]  polyethylene glycol (MIRALAX / GLYCOLAX) packet Take 17 g by mouth daily as needed for mild constipation.   Yes [provider]    Family History Family History  Problem Relation Age of Onset  . COPD Mother   . Osteoporosis Mother  Social History Social History   Tobacco Use  . Smoking status: Current Every Day Smoker    Packs/day: 1.00    Types: Cigarettes  . Smokeless tobacco: Never Used  Substance Use Topics  . Alcohol use: No  . Drug use: Yes    Types: Marijuana     Allergies   Red dye   Review of Systems Review of Systems  Constitutional: Negative for chills and fever.  Respiratory: Negative for cough and shortness of breath.   Musculoskeletal: Negative for neck pain.  Skin: Positive for wound. Negative for rash.  All other systems reviewed and are negative.    Physical Exam Updated Vital Signs BP 103/78 (BP Location: Left Arm)   Pulse 84   Temp 98.2 F (36.8  C) (Axillary)   Resp 18   SpO2 100%   Physical Exam  Constitutional: He is oriented to person, place, and time. He appears well-developed and well-nourished.  HENT:  Head: Normocephalic and atraumatic.  Eyes: EOM are normal. Pupils are equal, round, and reactive to light.  Neck: Normal range of motion. Neck supple.  Open trach stoma.  No obvious infection, bleeding  Cardiovascular: Normal rate and regular rhythm.  Pulmonary/Chest: Effort normal and breath sounds normal.  Abdominal: Soft. Bowel sounds are normal. There is no tenderness. There is no rebound and no guarding.  Musculoskeletal: Normal range of motion. He exhibits no edema or tenderness.  Insertion site for right lower extremity drainage tube appears to is closed.  Mild surrounding erythema without purulent discharge or warmth.  Neurological: He is alert and oriented to person, place, and time.  Skin: Skin is warm and dry. No rash noted. No erythema.  Psychiatric: He has a normal mood and affect. His behavior is normal.  Nursing note and vitals reviewed.    ED Treatments / Results  Labs (all labs ordered are listed, but only abnormal results are displayed) Labs Reviewed - No data to display  EKG  EKG Interpretation None       Radiology Dg Neck Soft Tissue  Result Date: 01/30/2017 CLINICAL DATA:  Tracheostomy placement. EXAM: NECK SOFT TISSUES - 1+ VIEW COMPARISON:  None. FINDINGS: AP and lateral views of the neck demonstrate apparent satisfactory tracheostomy placement the tip of the tracheostomy appears to be approximately 4.8 cm above the carina. On the lateral view, tracheostomy appears to be within the air column. IMPRESSION: Apparent satisfactory tracheostomy placement. Electronically Signed   By: Elsie StainJohn T Curnes M.D.   On: 01/30/2017 13:51    Procedures Procedures (including critical care time)  Medications Ordered in ED Medications - No data to display   Initial Impression / Assessment and Plan / ED  Course  I have reviewed the triage vital signs and the nursing notes.  Pertinent labs & imaging results that were available during my care of the patient were reviewed by me and considered in my medical decision making (see chart for details).     We will attempt to replace trach.  Trach replaced.  X-ray shows trach to be in good position.  Discharged home to follow-up with the trach clinic. Final Clinical Impressions(s) / ED Diagnoses   Final diagnoses:  Complication of tracheostomy tube [facility](HCC)    ED Discharge Orders    None       Loren RacerYelverton, Norah Fick, MD 01/30/17 1531

## 2017-02-02 ENCOUNTER — Other Ambulatory Visit: Payer: Self-pay

## 2017-02-02 ENCOUNTER — Emergency Department (HOSPITAL_COMMUNITY)
Admission: EM | Admit: 2017-02-02 | Discharge: 2017-02-02 | Disposition: A | Discharge status: Home / Self Care | Payer: Medicare Other | Attending: Emergency Medicine | Admitting: Emergency Medicine

## 2017-02-02 ENCOUNTER — Encounter (HOSPITAL_COMMUNITY): Payer: Self-pay | Admitting: *Deleted

## 2017-02-02 DIAGNOSIS — Z79899 Other long term (current) drug therapy: Secondary | ICD-10-CM | POA: Diagnosis not present

## 2017-02-02 DIAGNOSIS — Z430 Encounter for attention to tracheostomy: Secondary | ICD-10-CM

## 2017-02-02 DIAGNOSIS — R0602 Shortness of breath: Secondary | ICD-10-CM | POA: Diagnosis present

## 2017-02-02 DIAGNOSIS — T17998A Other foreign object in respiratory tract, part unspecified causing other injury, initial encounter: Secondary | ICD-10-CM | POA: Diagnosis not present

## 2017-02-02 DIAGNOSIS — Y9389 Activity, other specified: Secondary | ICD-10-CM | POA: Insufficient documentation

## 2017-02-02 DIAGNOSIS — E039 Hypothyroidism, unspecified: Secondary | ICD-10-CM | POA: Diagnosis not present

## 2017-02-02 DIAGNOSIS — Y92018 Other place in single-family (private) house as the place of occurrence of the external cause: Secondary | ICD-10-CM | POA: Diagnosis not present

## 2017-02-02 DIAGNOSIS — T17500A Unspecified foreign body in bronchus causing asphyxiation, initial encounter: Secondary | ICD-10-CM

## 2017-02-02 DIAGNOSIS — J9809 Other diseases of bronchus, not elsewhere classified: Secondary | ICD-10-CM

## 2017-02-02 DIAGNOSIS — X58XXXA Exposure to other specified factors, initial encounter: Secondary | ICD-10-CM | POA: Diagnosis not present

## 2017-02-02 DIAGNOSIS — Z87891 Personal history of nicotine dependence: Secondary | ICD-10-CM | POA: Insufficient documentation

## 2017-02-02 DIAGNOSIS — Y999 Unspecified external cause status: Secondary | ICD-10-CM | POA: Insufficient documentation

## 2017-02-02 DIAGNOSIS — Z7982 Long term (current) use of aspirin: Secondary | ICD-10-CM | POA: Insufficient documentation

## 2017-02-02 NOTE — Progress Notes (Signed)
Pt complains of SOB and discomfort from inner cannula/cap. Cleaned trach area/flange. Replaced drain sponge. Suctioned pt and replace red cap. Pt tolerating well. O2 saturation increased to 97% from 92%. Spoke to and educated pt and his mother/caregiver about suctioning, trach care and importance of establishing care with a pulmonologist. Both expressed relief and understanding after explaining the above topics.Pt encouraged to work with home health company and pulmonologist.

## 2017-02-02 NOTE — ED Notes (Signed)
This pt's mother was very upset about the care she received at [facility] a couple [facility] days ago. Kim, RT went in to this pt's room and sat with the pt and his mother and answered questions this pt's mother had. The pt was able to follow along and learn with his mother as well. This RN went by the pt's room and the mother had a big smile on her face.

## 2017-02-02 NOTE — ED Triage Notes (Signed)
Pt brought in by [facility] for c/o breathing difficulty; ems arrived to find pt having difficulty breathing and pt's mother was trying to suction out pt; ems suctioned pt and a thick white mucous plug came out; pt is in no distress at this time; O2 sats have been in mid to upper 90's since arrival of ems

## 2017-02-02 NOTE — Discharge Instructions (Signed)
[facility] this coming week. Suction as needed. Recheck as needed.

## 2017-02-02 NOTE — ED Provider Notes (Signed)
[facility] Provider Note   CSN: [id number] Arrival date & time: 02/02/17  [id number]  Time seen 3:57 AM   History   Chief Complaint Chief Complaint  Patient presents with  . Shortness of Breath    HPI Alex Crawford is a 46 y.o. male.  HPI patient and mother indicate he was fine all day.  He woke up acutely during the night feeling like he was having trouble breathing.  Mother tried to suction him but states she is having trouble operating the new suction device.  She states he used to have a clip on it was easier for her to manage.  EMS report they suctioned him and then patient coughed shortly after arriving to the ED and coughed up a mucous plug and he states now his breathing is fine.  He denies any cough or fever. He has an appointment at the trach clinic on December 19.  [facility].  Patient had reconstructive mandible surgery with custom fabricated titanium bone plate [facility] on [facility].  He had a planned tracheostomy done with that surgery.  He was seen in the ED on December 12 when his trach came out.  Mother was upset and said the respiratory therapist really did not explain how to use the different type of tracheostomy tube as far suctioning.  PCP Claudette LawsEden, [facility]   Past Medical History:  Diagnosis Date  . Kidney stone   . Narcolepsy   . TBI (traumatic brain injury) (HCC)   . Thyroid disease     Patient Active Problem List   Diagnosis Date Noted  . History of traumatic brain injury 11/14/2015  . Gait disturbance 11/14/2015  . 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 APPENDECTOMY    . FACIAL RECONSTRUCTION SURGERY    . HERNIA REPAIR    . LITHOTRIPSY         Home Medications    Prior to Admission medications   Medication Sig Start Date End Date Taking? Authorizing Provider  Apoaequorin  (PREVAGEN PO) Take 1 tablet by mouth daily.     [provider]  ASPIRIN LOW DOSE 81 MG EC tablet Take 81 mg by mouth daily. 01/25/17   [provider]  diazepam (VALIUM) 5 MG tablet Take 1 tablet (5 mg total) by mouth every 8 (eight) hours as needed (dizziness). Patient taking differently: Take 5 mg by mouth at bedtime.  09/06/14   Trixie DredgeWest, Emily, PA-C  escitalopram (LEXAPRO) 20 MG tablet Take 20 mg by mouth at bedtime. 06/09/15   [provider]  famotidine (PEPCID) 20 MG tablet Take 20 mg by mouth 2 (two) times daily.    [provider]  gabapentin (NEURONTIN) 300 MG capsule Take 300 mg by mouth 2 (two) times daily.    [provider]  levothyroxine (SYNTHROID, LEVOTHROID) 112 MCG tablet Take 112 mcg by mouth daily before breakfast.    [provider]  Melatonin 3 MG TABS Take 3 mg by mouth at bedtime.    [provider]  moxifloxacin (VIGAMOX) 0.5 % ophthalmic solution Place 1 drop into both eyes See admin instructions. Patient taking differently: Place 1 drop into both eyes daily as needed (for burning and red eyes).  06/23/14   Muthersbaugh, Dahlia ClientHannah, PA-C  Multiple Vitamins-Minerals [facility](THERAGRAN-M ADVANCED 50 PLUS) TABS Take 1 tablet by mouth  daily.    [provider]  Nutritional Supplements (FEEDING SUPPLEMENT, OSMOLITE 1.5 CAL,) LIQD Place 237 mLs into feeding tube 4 (four) times daily.    [provider]  ondansetron (ZOFRAN) 4 MG tablet Take 4 mg by mouth every 6 (six) hours as needed for nausea/vomiting. 01/25/17   [provider]  oxyCODONE (OXY IR/ROXICODONE) 5 MG immediate release tablet Take 5 mg by mouth every 6 (six) hours as needed for pain. 01/24/17   [provider]  polyethylene glycol (MIRALAX / GLYCOLAX) packet Take 17 g by mouth daily as needed for mild constipation.    [provider]    Family History Family History  Problem Relation Age of Onset  . COPD Mother   .  Osteoporosis Mother     Social History Social History   Tobacco Use  . Smoking status: Former Smoker    Packs/day: 1.00    Types: Cigarettes  . Smokeless tobacco: Never Used  Substance Use Topics  . Alcohol use: No  . Drug use: Yes    Types: Marijuana     Allergies   Red dye   Review of Systems Review of Systems  All other systems reviewed and are negative.    Physical Exam Updated Vital Signs BP 116/85   Pulse 75   Temp 98 F (36.7 C) (Axillary)   Resp 16   Ht 5\' 10"  (1.778 m)   Wt 55.8 kg (123 lb)   SpO2 96%   BMI 17.65 kg/m   Vital signs normal    Physical Exam  Constitutional: He is oriented to person, place, and time.  Non-toxic appearance. He does not appear ill. No distress.  Thin male  HENT:  Head: Normocephalic and atraumatic.  Right Ear: External ear normal.  Left Ear: External ear normal.  Nose: Nose normal. No mucosal edema or rhinorrhea.  Mouth/Throat: Oropharynx is clear and moist and mucous membranes are normal. No dental abscesses or uvula swelling.  Edentulous Patient has surgical scars underneath his mandible bilaterally.  Eyes: Conjunctivae and EOM are normal. Pupils are equal, round, and reactive to light.  Neck: Normal range of motion and full passive range of motion without pain. Neck supple.  Janina Mayorach is in place  Cardiovascular: Normal rate, regular rhythm and normal heart sounds. Exam reveals no gallop and no friction rub.  No murmur heard. Pulmonary/Chest: Effort normal and breath sounds normal. No respiratory distress. He has no wheezes. He has no rhonchi. He has no rales. He exhibits no tenderness and no crepitus.  Patient is breathing fine at rest, however when I have him take big deep breaths he has transmitted rattling in both lung fields.  Abdominal: Soft. Normal appearance and bowel sounds are normal. He exhibits no distension. There is no tenderness. There is no rebound and no guarding.  Musculoskeletal: Normal range of  motion. He exhibits no edema or tenderness.  Moves all extremities well.   Neurological: He is alert and oriented to person, place, and time. He has normal strength. No cranial nerve deficit.  Skin: Skin is warm, dry and intact. No rash noted. No erythema. No pallor.  Psychiatric: He has a normal mood and affect. His speech is normal and behavior is normal. His mood appears not anxious.  Nursing note and vitals reviewed.    ED Treatments / Results  Labs (all labs ordered are listed, but only abnormal results are displayed) Labs Reviewed - No data to display  EKG  EKG Interpretation None  Radiology No results found.  Procedures Procedures (including critical care time)  Medications Ordered in ED Medications - No data to display   Initial Impression / Assessment and Plan / ED Course  I have reviewed the triage vital signs and the nursing notes.  Pertinent labs & imaging results that were available during my care of the patient were reviewed by me and considered in my medical decision making (see chart for details).     Respiratory was called to suction the patient again.  I am also going to have them see if they have the device mother has used before with suctioning his trach.  The respiratory therapist found a cap similar to what the mother was familiar with.  She was also instructed her on how to take care of her son's tracheostomy wound site and how to suction him.  Mother felt more comfortable doing this at the time she left.  Final Clinical Impressions(s) / ED Diagnoses   Final diagnoses:  Mucus plugging of bronchi  Tracheostomy care [facility])    ED Discharge Orders    None      Plan discharge  Devoria Albe, MD, Concha Pyo, MD 02/02/17 [phone]

## 2017-02-02 NOTE — ED Notes (Signed)
Respiratory therapist is in with this pt ans is speaking with the pt and his mother.

## 2017-02-06 ENCOUNTER — Ambulatory Visit (HOSPITAL_COMMUNITY)
Admit: 2017-02-06 | Discharge: 2017-02-06 | Disposition: A | Discharge status: Home / Self Care | Payer: Medicare Other | Attending: Family Medicine | Admitting: Family Medicine

## 2017-02-06 DIAGNOSIS — Z87820 Personal history of traumatic brain injury: Secondary | ICD-10-CM | POA: Insufficient documentation

## 2017-02-06 DIAGNOSIS — Z87891 Personal history of nicotine dependence: Secondary | ICD-10-CM | POA: Diagnosis not present

## 2017-02-06 DIAGNOSIS — Z430 Encounter for attention to tracheostomy: Secondary | ICD-10-CM | POA: Insufficient documentation

## 2017-02-06 DIAGNOSIS — E079 Disorder of thyroid, unspecified: Secondary | ICD-10-CM | POA: Insufficient documentation

## 2017-02-06 DIAGNOSIS — Z87442 Personal history of urinary calculi: Secondary | ICD-10-CM | POA: Insufficient documentation

## 2017-02-06 DIAGNOSIS — Z930 Tracheostomy status: Secondary | ICD-10-CM | POA: Diagnosis not present

## 2017-02-06 DIAGNOSIS — Z79890 Hormone replacement therapy (postmenopausal): Secondary | ICD-10-CM | POA: Diagnosis not present

## 2017-02-06 DIAGNOSIS — Z79899 Other long term (current) drug therapy: Secondary | ICD-10-CM | POA: Diagnosis not present

## 2017-02-06 NOTE — Progress Notes (Signed)
Tracheostomy Procedure Note  Rowe PavyJeffrey A Heemstra [id number] 10/30/1970  Pre Procedure Tracheostomy Information  Trach Brand: Shiley Size: 6.0 Style: Uncuffed Secured by: Velcro   Procedure: trach change with bronchoscopy    Post Procedure Tracheostomy Information  Trach Brand: Shiley Size: 6.0 Style: Uncuffed Secured by: Velcro   Post Procedure Evaluation:  ETCO2 positive color change from yellow to purple : Yes.   Vital signs:blood pressure 87/63, pulse 77, respirations 20 and pulse oximetry 99 % Patients current condition: stable Complications: No apparent complications Trach site exam: clean, dry Wound care done: dry and 4 x 4 gauze Patient did tolerate procedure well.   Education: Educated mom on changing the inner cannula  Prescription needs: Spare trach given    Additional needs: none
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
## 2017-02-23 ENCOUNTER — Emergency Department (HOSPITAL_COMMUNITY)
Admission: EM | Admit: 2017-02-23 | Discharge: 2017-02-24 | Disposition: A | Discharge status: Home / Self Care | Payer: Medicare Other | Attending: Emergency Medicine | Admitting: Emergency Medicine

## 2017-02-23 ENCOUNTER — Encounter (HOSPITAL_COMMUNITY): Payer: Self-pay | Admitting: Emergency Medicine

## 2017-02-23 ENCOUNTER — Other Ambulatory Visit: Payer: Self-pay

## 2017-02-23 DIAGNOSIS — Z79899 Other long term (current) drug therapy: Secondary | ICD-10-CM | POA: Diagnosis not present

## 2017-02-23 DIAGNOSIS — Z7982 Long term (current) use of aspirin: Secondary | ICD-10-CM | POA: Diagnosis not present

## 2017-02-23 DIAGNOSIS — Z430 Encounter for attention to tracheostomy: Secondary | ICD-10-CM | POA: Insufficient documentation

## 2017-02-23 DIAGNOSIS — J398 Other specified diseases of upper respiratory tract: Secondary | ICD-10-CM

## 2017-02-23 DIAGNOSIS — Z87891 Personal history of nicotine dependence: Secondary | ICD-10-CM | POA: Diagnosis not present

## 2017-02-23 MED ORDER — LIDOCAINE-EPINEPHRINE (PF) 2 %-1:200000 IJ SOLN
10.0000 mL | Freq: Once | INTRAMUSCULAR | Status: AC
  Administered 2017-02-24: 10 mL

## 2017-02-23 NOTE — ED Triage Notes (Addendum)
Pt's mother found that his trach tube was out when she was checking on him.  This has happened before, pt "did not know it had come out."  No respiratory distress, O2 stats 96% on room air.  Mother was unable to place trach back in.

## 2017-02-24 NOTE — Progress Notes (Signed)
RT assisted ED physician with a trach insertion. RT attempted to place but could not. Patient tolerated procedure well. Bilateral breath sounds present. RT will continue to monitor as needed.

## 2017-02-24 NOTE — ED Provider Notes (Signed)
[facility] EMERGENCY [facility]PARTMENT Provider Note   CSN: [id number] Arrival date & time: 02/23/17  2248     History   Chief Complaint Chief Complaint  Patient presents with  . Tracheostomy Tube Change    HPI Alex Crawford is a 47 y.o. male.  Patient presents to the emergency department because his tracheostomy tube has dislodged.  He is not sure how it occurred.  No bleeding, no pain.  He is not in any respiratory distress.  Mother was unable to replace the tube herself.      Past Medical History:  Diagnosis Date  . Kidney stone   . Narcolepsy   . TBI (traumatic brain injury) (HCC)   . Thyroid disease     Patient Active Problem List   Diagnosis Date Noted  . Tracheostomy dependence (HCC)   . History of traumatic brain injury 11/14/2015  . Gait disturbance 11/14/2015  . 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 APPENDECTOMY    . FACIAL RECONSTRUCTION SURGERY    . HERNIA REPAIR    . LITHOTRIPSY         Home Medications    Prior to Admission medications   Medication Sig Start Date End Date Taking? Authorizing Provider  Apoaequorin (PREVAGEN PO) Take 1 tablet by mouth daily.     [provider]  ASPIRIN LOW DOSE 81 MG EC tablet Take 81 mg by mouth daily. 01/25/17   [provider]  diazepam (VALIUM) 5 MG tablet Take 1 tablet (5 mg total) by mouth every 8 (eight) hours as needed (dizziness). Patient taking differently: Take 5 mg by mouth at bedtime.  09/06/14   Trixie DredgeWest, Emily, PA-C  escitalopram (LEXAPRO) 20 MG tablet Take 20 mg by mouth at bedtime. 06/09/15   [provider]  famotidine (PEPCID) 20 MG tablet Take 20 mg by mouth 2 (two) times daily.    [provider]  gabapentin (NEURONTIN) 300 MG capsule Take 300 mg by mouth 2 (two) times daily.    [provider]  levothyroxine (SYNTHROID, LEVOTHROID) 112 MCG  tablet Take 112 mcg by mouth daily before breakfast.    [provider]  Melatonin 3 MG TABS Take 3 mg by mouth at bedtime.    [provider]  moxifloxacin (VIGAMOX) 0.5 % ophthalmic solution Place 1 drop into both eyes See admin instructions. Patient taking differently: Place 1 drop into both eyes daily as needed (for burning and red eyes).  06/23/14   Muthersbaugh, Dahlia ClientHannah, PA-C  Multiple Vitamins-Minerals [facility](THERAGRAN-M ADVANCED 50 PLUS) TABS Take 1 tablet by mouth daily.    [provider]  Nutritional Supplements (FEEDING SUPPLEMENT, OSMOLITE 1.5 CAL,) LIQD Place 237 mLs into feeding tube 4 (four) times daily.    [provider]  ondansetron (ZOFRAN) 4 MG tablet Take 4 mg by mouth every 6 (six) hours as needed for nausea/vomiting. 01/25/17   [provider]  oxyCODONE (OXY IR/ROXICODONE) 5 MG immediate release tablet Take 5 mg by mouth every 6 (six) hours as needed for pain. 01/24/17   [provider]  polyethylene glycol (MIRALAX / GLYCOLAX) packet Take 17 g by mouth daily as needed for mild constipation.    [provider]    Family History Family History  Problem Relation Age of Onset  . COPD Mother   . Osteoporosis Mother     Social History Social  History   Tobacco Use  . Smoking status: Former Smoker    Packs/day: 1.00    Types: Cigarettes  . Smokeless tobacco: Never Used  Substance Use Topics  . Alcohol use: No  . Drug use: Yes    Types: Marijuana     Allergies   Red dye   Review of Systems Review of Systems  Respiratory: Negative for shortness of breath.   All other systems reviewed and are negative.    Physical Exam Updated Vital Signs BP 107/78   Pulse (!) 57   Resp 20   SpO2 97%   Physical Exam  Constitutional: He is oriented to person, place, and time. He appears well-developed and well-nourished. No distress.  HENT:  Head: Normocephalic and atraumatic.  Right Ear: Hearing normal.  Left  Ear: Hearing normal.  Nose: Nose normal.  Mouth/Throat: Oropharynx is clear and moist and mucous membranes are normal.  Eyes: Conjunctivae and EOM are normal. Pupils are equal, round, and reactive to light.  Neck: Normal range of motion. Neck supple.  Stoma without any obvious trauma  Cardiovascular: Regular rhythm, S1 normal and S2 normal. Exam reveals no gallop and no friction rub.  No murmur heard. Pulmonary/Chest: Effort normal and breath sounds normal. No respiratory distress. He exhibits no tenderness.  Abdominal: Soft. Normal appearance and bowel sounds are normal. There is no hepatosplenomegaly. There is no tenderness. There is no rebound, no guarding, no tenderness at McBurney's point and negative Murphy's sign. No hernia.  Musculoskeletal: Normal range of motion.  Neurological: He is alert and oriented to person, place, and time. He has normal strength. No cranial nerve deficit or sensory deficit. Coordination normal. GCS eye subscore is 4. GCS verbal subscore is 5. GCS motor subscore is 6.  Skin: Skin is warm, dry and intact. No rash noted. No cyanosis.  Psychiatric: He has a normal mood and affect. His speech is normal and behavior is normal. Thought content normal.  Nursing note and vitals reviewed.    ED Treatments / Results  Labs (all labs ordered are listed, but only abnormal results are displayed) Labs Reviewed - No data to display  EKG  EKG Interpretation None       Radiology No results found.  Procedures TRACHEOSTOMY REPLACEMENT Date/Time: 02/24/2017 12:34 AM Performed by: Gilda Crease, MD Authorized by: Gilda Crease, MD  Consent: Verbal consent obtained. Consent given by: patient Patient understanding: patient states understanding of the procedure being performed Patient consent: the patient's understanding of the procedure matches consent given Procedure consent: procedure consent matches procedure scheduled Relevant documents:  relevant documents present and verified Test results: test results available and properly labeled Site marked: the operative site was marked Patient identity confirmed: verbally with patient Time out: Immediately prior to procedure a "time out" was called to verify the correct patient, procedure, equipment, support staff and site/side marked as required. Indications: became dislodged Local anesthesia used: yes  Anesthesia: Local anesthesia used: yes Local Anesthetic: lidocaine 2% with epinephrine Anesthetic total: 2 mL  Sedation: Patient sedated: no  Preparation: Patient was prepped and draped in the usual sterile fashion. Tube type: single cannula Tube cuff: cuffless Tube size: 6.0 mm Complications: bleeding Patient tolerance: Patient tolerated the procedure well with no immediate complications    (including critical care time)  Medications Ordered in ED Medications  lidocaine-EPINEPHrine (XYLOCAINE W/EPI) 2 %-1:200000 (PF) injection 10 mL (not administered)     Initial Impression / Assessment and Plan / ED Course  I have  reviewed the triage vital signs and the nursing notes.  Pertinent labs & imaging results that were available during my care of the patient were reviewed by me and considered in my medical decision making (see chart for details).     Patient presents for replacement of his tracheostomy tube.  Attempt by respiratory therapy to replace the cannula was unsuccessful because stoma had contracted.  I therefore injected both sides of the stoma with lidocaine with epinephrine.  I was able to replace the tube with constant pressure to the area until the cannula passed through the stoma.  There was some bleeding initially.  Patient was monitored for a period of time after the procedure, no further bleeding, no respiratory distress.  Patient is at his his baseline.  Final Clinical Impressions(s) / ED Diagnoses   Final diagnoses:  Trachea displaced    ED Discharge  Orders    None       Copelan Maultsby, Canary Brim, MD 02/24/17 [id number]

## 2017-04-07 ENCOUNTER — Other Ambulatory Visit: Payer: Self-pay

## 2017-04-07 DIAGNOSIS — Z87891 Personal history of nicotine dependence: Secondary | ICD-10-CM | POA: Insufficient documentation

## 2017-04-07 DIAGNOSIS — J9509 Other tracheostomy complication: Secondary | ICD-10-CM | POA: Insufficient documentation

## 2017-04-07 DIAGNOSIS — Z79899 Other long term (current) drug therapy: Secondary | ICD-10-CM | POA: Insufficient documentation

## 2017-04-07 DIAGNOSIS — Z7982 Long term (current) use of aspirin: Secondary | ICD-10-CM | POA: Diagnosis not present

## 2017-04-08 ENCOUNTER — Encounter (HOSPITAL_COMMUNITY): Payer: Self-pay | Admitting: Emergency Medicine

## 2017-04-08 ENCOUNTER — Emergency Department (HOSPITAL_COMMUNITY)
Admission: EM | Admit: 2017-04-08 | Discharge: 2017-04-08 | Disposition: A | Discharge status: Home / Self Care | Payer: Medicare Other | Attending: Emergency Medicine | Admitting: Emergency Medicine

## 2017-04-08 DIAGNOSIS — J9509 Other tracheostomy complication: Secondary | ICD-10-CM

## 2017-04-08 NOTE — ED Notes (Signed)
#  6 uncuffed trach brought to ED by OR RN,  Dr. Blinda LeatherwoodPollina placed trach on first attempt, pt tolerated well

## 2017-04-08 NOTE — ED Triage Notes (Signed)
Pt arrived via PV with trach dislodged, trach attached to strap hanging on chest.  Mother states she when in to give pt his medication @ 2310 and found pt with trach out.  Trach placed 11-18

## 2017-04-08 NOTE — Progress Notes (Signed)
Pt. Came into the ED to have his trache placed back in, MD placed new 6 cfs shiley back into pt. Pt. Stable and tolerating room air with sats 95% and greater.
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
## 2017-05-01 ENCOUNTER — Inpatient Hospital Stay (HOSPITAL_COMMUNITY): Admission: RE | Admit: 2017-05-01 | Payer: Medicare Other | Source: Ambulatory Visit

## 2017-05-07 ENCOUNTER — Ambulatory Visit (INDEPENDENT_AMBULATORY_CARE_PROVIDER_SITE_OTHER): Payer: Medicare HMO | Admitting: Psychiatry

## 2017-05-07 ENCOUNTER — Encounter (HOSPITAL_COMMUNITY): Payer: Self-pay | Admitting: Psychiatry

## 2017-05-07 ENCOUNTER — Other Ambulatory Visit (HOSPITAL_COMMUNITY): Payer: Self-pay | Admitting: Psychiatry

## 2017-05-07 VITALS — BP 112/70 | HR 75 | Ht 65.0 in | Wt 115.0 lb

## 2017-05-07 DIAGNOSIS — G4700 Insomnia, unspecified: Secondary | ICD-10-CM

## 2017-05-07 DIAGNOSIS — F0630 Mood disorder due to known physiological condition, unspecified: Secondary | ICD-10-CM

## 2017-05-07 DIAGNOSIS — F39 Unspecified mood [affective] disorder: Secondary | ICD-10-CM

## 2017-05-07 DIAGNOSIS — F4323 Adjustment disorder with mixed anxiety and depressed mood: Secondary | ICD-10-CM

## 2017-05-07 DIAGNOSIS — F331 Major depressive disorder, recurrent, moderate: Secondary | ICD-10-CM

## 2017-05-07 DIAGNOSIS — F411 Generalized anxiety disorder: Secondary | ICD-10-CM | POA: Diagnosis not present

## 2017-05-07 MED ORDER — ESCITALOPRAM OXALATE 20 MG PO TABS: 20.0000 mg | ORAL_TABLET | Freq: Every day | ORAL | 1 refills | Status: AC

## 2017-05-07 NOTE — Patient Instructions (Signed)
Refer to therapy 
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
## 2017-05-31 ENCOUNTER — Ambulatory Visit (HOSPITAL_COMMUNITY): Payer: Self-pay | Admitting: Psychiatry

## 2017-06-08 ENCOUNTER — Emergency Department (HOSPITAL_COMMUNITY): Payer: Medicare HMO

## 2017-06-08 ENCOUNTER — Emergency Department (HOSPITAL_COMMUNITY)
Admission: EM | Admit: 2017-06-08 | Discharge: 2017-06-08 | Disposition: A | Discharge status: Home / Self Care | Payer: Medicare HMO | Attending: Emergency Medicine | Admitting: Emergency Medicine

## 2017-06-08 ENCOUNTER — Encounter (HOSPITAL_COMMUNITY): Payer: Self-pay | Admitting: Emergency Medicine

## 2017-06-08 DIAGNOSIS — R0602 Shortness of breath: Secondary | ICD-10-CM | POA: Diagnosis present

## 2017-06-08 DIAGNOSIS — R05 Cough: Secondary | ICD-10-CM | POA: Diagnosis not present

## 2017-06-08 DIAGNOSIS — Z87820 Personal history of traumatic brain injury: Secondary | ICD-10-CM | POA: Diagnosis not present

## 2017-06-08 DIAGNOSIS — J9509 Other tracheostomy complication: Secondary | ICD-10-CM | POA: Insufficient documentation

## 2017-06-08 DIAGNOSIS — Z430 Encounter for attention to tracheostomy: Secondary | ICD-10-CM | POA: Insufficient documentation

## 2017-06-08 DIAGNOSIS — Z87891 Personal history of nicotine dependence: Secondary | ICD-10-CM | POA: Insufficient documentation

## 2017-06-08 DIAGNOSIS — Z79899 Other long term (current) drug therapy: Secondary | ICD-10-CM | POA: Insufficient documentation

## 2017-06-08 LAB — CBC WITH DIFFERENTIAL/PLATELET
Basophils Absolute: 0 10*3/uL (ref 0.0–0.1)
Basophils Relative: 0 %
Eosinophils Absolute: 0.2 10*3/uL (ref 0.0–0.7)
Eosinophils Relative: 3 %
HEMATOCRIT: 39.4 % (ref 39.0–52.0)
HEMOGLOBIN: 13.1 g/dL (ref 13.0–17.0)
LYMPHS ABS: 3.1 10*3/uL (ref 0.7–4.0)
Lymphocytes Relative: 36 %
MCH: 30.5 pg (ref 26.0–34.0)
MCHC: 33.2 g/dL (ref 30.0–36.0)
MCV: 91.6 fL (ref 78.0–100.0)
MONOS PCT: 12 %
Monocytes Absolute: 1 10*3/uL (ref 0.1–1.0)
NEUTROS ABS: 4.3 10*3/uL (ref 1.7–7.7)
Neutrophils Relative %: 49 %
Platelets: 279 10*3/uL (ref 150–400)
RBC: 4.3 MIL/uL (ref 4.22–5.81)
RDW: 15.1 % (ref 11.5–15.5)
WBC: 8.7 10*3/uL (ref 4.0–10.5)

## 2017-06-08 LAB — BASIC METABOLIC PANEL
Anion gap: 11 (ref 5–15)
BUN: 16 mg/dL (ref 6–20)
CHLORIDE: 105 mmol/L (ref 101–111)
CO2: 23 mmol/L (ref 22–32)
Calcium: 10.1 mg/dL (ref 8.9–10.3)
Creatinine, Ser: 0.76 mg/dL (ref 0.61–1.24)
GFR calc non Af Amer: >60 mL/min (ref >60)
Glucose, Bld: 109 mg/dL — ABNORMAL HIGH (ref 65–99)
Potassium: 3.8 mmol/L (ref 3.5–5.1)
Sodium: 139 mmol/L (ref 135–145)
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
## 2017-06-28 ENCOUNTER — Encounter (HOSPITAL_COMMUNITY): Payer: Self-pay | Admitting: *Deleted

## 2017-06-28 ENCOUNTER — Other Ambulatory Visit: Payer: Self-pay

## 2017-06-28 ENCOUNTER — Emergency Department (HOSPITAL_COMMUNITY)
Admission: EM | Admit: 2017-06-28 | Discharge: 2017-06-28 | Disposition: A | Discharge status: Home / Self Care | Payer: Medicare HMO | Attending: Emergency Medicine | Admitting: Emergency Medicine

## 2017-06-28 ENCOUNTER — Emergency Department (HOSPITAL_COMMUNITY): Payer: Medicare HMO

## 2017-06-28 DIAGNOSIS — Z87891 Personal history of nicotine dependence: Secondary | ICD-10-CM | POA: Insufficient documentation

## 2017-06-28 DIAGNOSIS — Z430 Encounter for attention to tracheostomy: Secondary | ICD-10-CM | POA: Diagnosis not present

## 2017-06-28 DIAGNOSIS — Y828 Other medical devices associated with adverse incidents: Secondary | ICD-10-CM | POA: Insufficient documentation

## 2017-06-28 DIAGNOSIS — Z79899 Other long term (current) drug therapy: Secondary | ICD-10-CM | POA: Insufficient documentation

## 2017-06-28 DIAGNOSIS — Z87820 Personal history of traumatic brain injury: Secondary | ICD-10-CM | POA: Insufficient documentation

## 2017-06-28 DIAGNOSIS — T85628A Displacement of other specified internal prosthetic devices, implants and grafts, initial encounter: Secondary | ICD-10-CM | POA: Insufficient documentation

## 2017-06-28 MED ORDER — LIDOCAINE HCL URETHRAL/MUCOSAL 2 % EX GEL
1.0000 "application " | Freq: Once | CUTANEOUS | Status: AC
  Administered 2017-06-28: 1 via TOPICAL
  Filled 2017-06-28: qty 20

## 2017-06-28 MED ORDER — LIDOCAINE HCL 2 % IJ SOLN
10.0000 mL | Freq: Once | INTRAMUSCULAR | Status: DC
  Filled 2017-06-28: qty 20

## 2017-06-28 NOTE — ED Triage Notes (Signed)
The pts trach came out at 0230  No respiratory distress but they called [facility] and the doctor there wants the trach replaced  He has his jaw is wired shut

## 2017-06-28 NOTE — Progress Notes (Signed)
Pt had a 6 CFS shiley that came out at home approximately 2-3 hours ago.  Attempted to put a new 6 CFS back in and unable to due to resistance able to place a 4 CFS Shiley with no issues.  ETCO2 confirms placement is ok, Bilateral BS clear dim.  Sat 99%.  Placed new trach ties and drain sponge.  Tolerated well.
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
## 2017-06-28 NOTE — ED Notes (Signed)
New trach placed by respiratory

## 2017-07-10 ENCOUNTER — Other Ambulatory Visit: Payer: Self-pay

## 2017-07-10 ENCOUNTER — Emergency Department (HOSPITAL_COMMUNITY)
Admission: EM | Admit: 2017-07-10 | Discharge: 2017-07-10 | Disposition: A | Discharge status: Home / Self Care | Payer: Medicare HMO | Attending: Emergency Medicine | Admitting: Emergency Medicine

## 2017-07-10 ENCOUNTER — Emergency Department (HOSPITAL_COMMUNITY): Payer: Medicare HMO

## 2017-07-10 ENCOUNTER — Encounter (HOSPITAL_COMMUNITY): Payer: Self-pay

## 2017-07-10 DIAGNOSIS — Z87891 Personal history of nicotine dependence: Secondary | ICD-10-CM | POA: Diagnosis not present

## 2017-07-10 DIAGNOSIS — Z79899 Other long term (current) drug therapy: Secondary | ICD-10-CM | POA: Diagnosis not present

## 2017-07-10 DIAGNOSIS — Z431 Encounter for attention to gastrostomy: Secondary | ICD-10-CM | POA: Diagnosis not present

## 2017-07-10 DIAGNOSIS — Z7982 Long term (current) use of aspirin: Secondary | ICD-10-CM | POA: Diagnosis not present

## 2017-07-10 DIAGNOSIS — E039 Hypothyroidism, unspecified: Secondary | ICD-10-CM | POA: Diagnosis not present

## 2017-07-10 DIAGNOSIS — Z931 Gastrostomy status: Secondary | ICD-10-CM

## 2017-07-10 HISTORY — DX: Other specified postprocedural states: Z98.890

## 2017-07-10 MED ORDER — IOPAMIDOL (ISOVUE-300) INJECTION 61%
INTRAVENOUS | Status: AC
  Administered 2017-07-10: 50 mL via GASTROSTOMY
  Filled 2017-07-10: qty 50

## 2017-07-10 NOTE — ED Notes (Signed)
Dr. Campos at bedside   

## 2017-07-10 NOTE — ED Triage Notes (Signed)
Pt brought in by mother due to feeding tube appears to be coming out. Pt has no other complaints. Pt normally goes to [facility] for feeding tube issues but closer to here. VSS.

## 2017-07-10 NOTE — Discharge Instructions (Addendum)
Please read attached information. If you experience any new or worsening signs or symptoms please return to the emergency room for evaluation. Please follow-up with your primary care provider or specialist as discussed.  °

## 2017-07-10 NOTE — ED Notes (Signed)
Jeff PA at bedside.  

## 2017-07-10 NOTE — ED Notes (Signed)
X-ray at bedside
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
## 2018-08-24 IMAGING — DX DG ABDOMEN 1V
1 series · 1 of 1 positions shown · non-contrast
Comparison: 01/03/2017

CLINICAL DATA: Possible dislodged feeding tube

EXAM:
ABDOMEN - 1 VIEW

[abdomen kub]
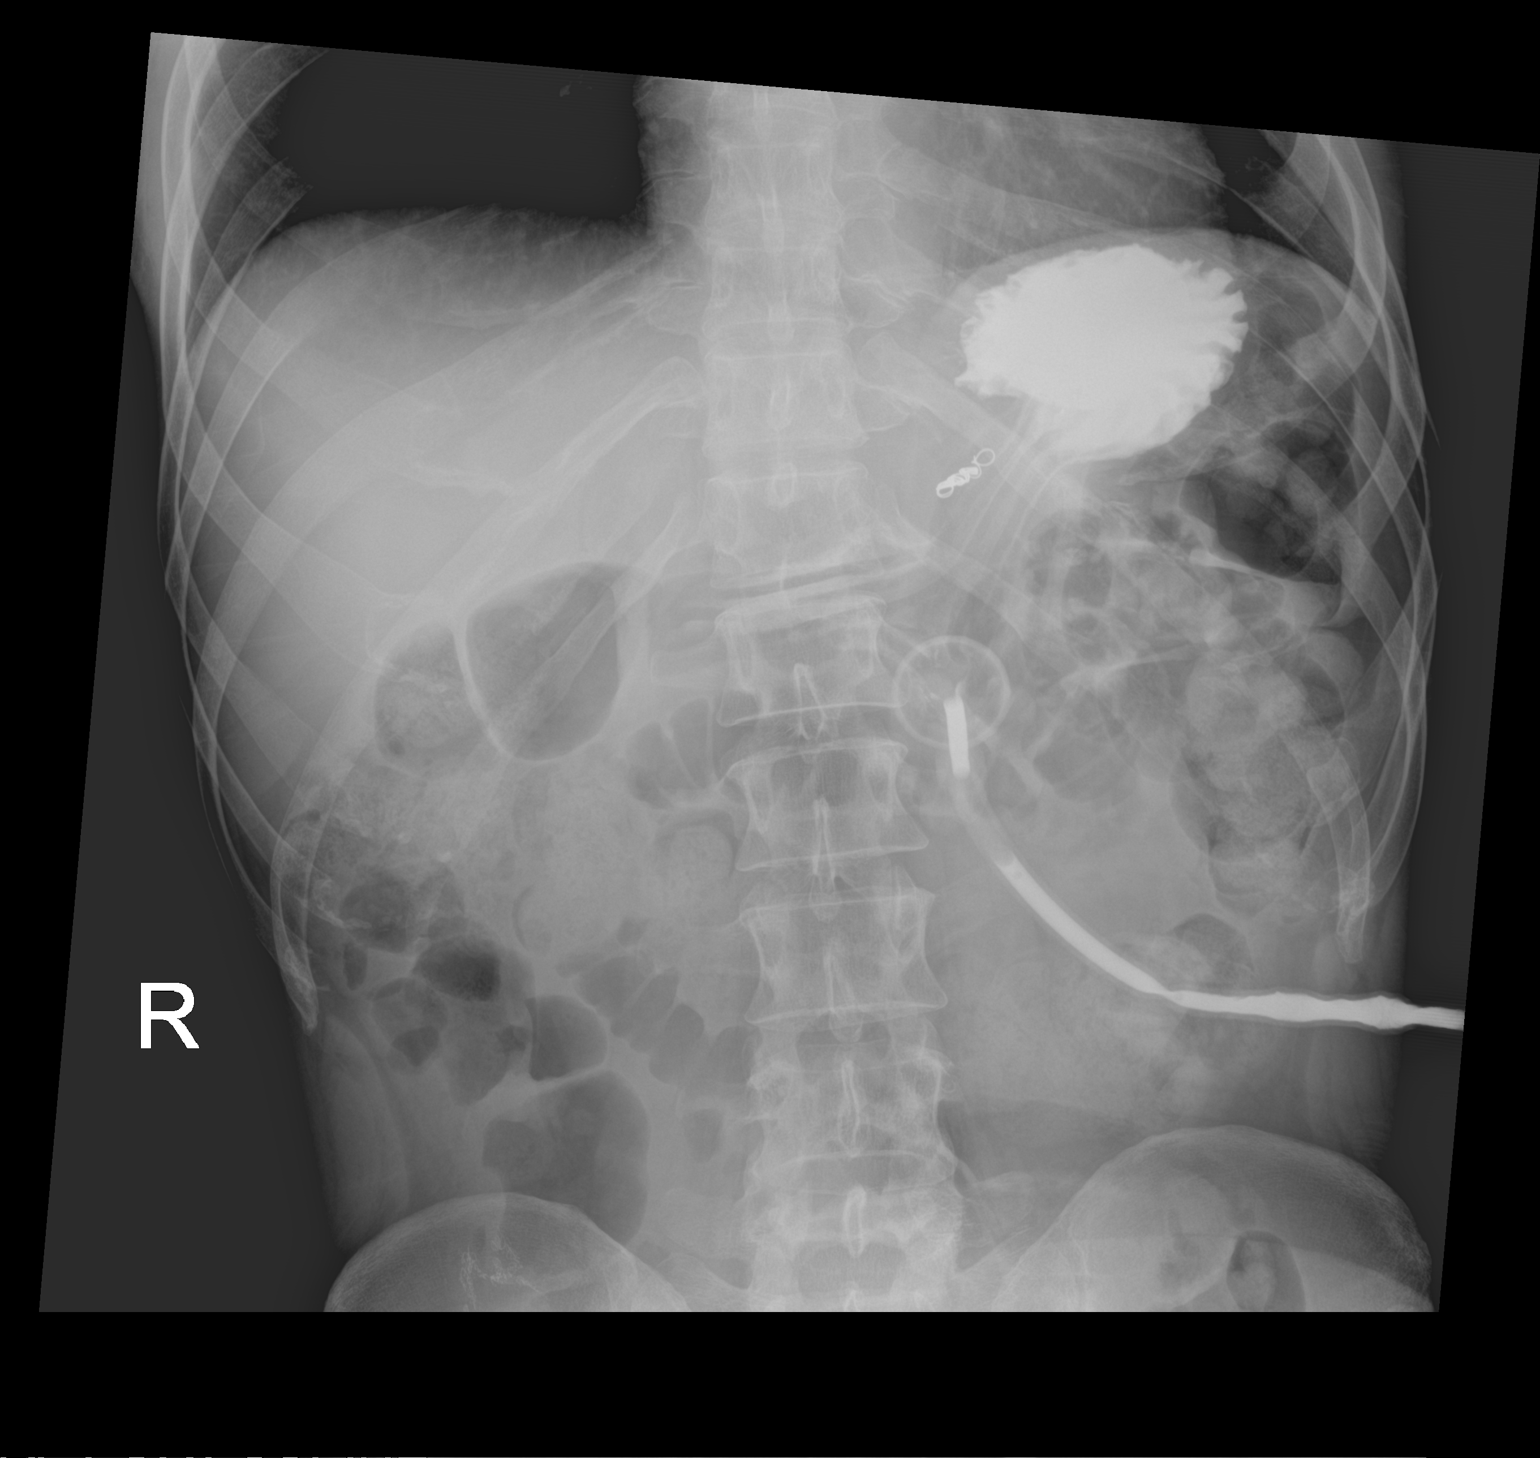

[1 of 1 positions shown; findings below may reference images not displayed]

FINDINGS: Coital in the left upper quadrant. Contrast within the fundus of the
stomach without gross extraluminal extravasation. Nonobstructed gas
pattern.
IMPRESSION: Contrast within the fundus of the stomach, without gross
extravasation. Nonobstructed bowel-gas pattern.

## 2022-08-28 ENCOUNTER — Other Ambulatory Visit: Payer: Self-pay

## 2022-08-28 ENCOUNTER — Emergency Department (HOSPITAL_BASED_OUTPATIENT_CLINIC_OR_DEPARTMENT_OTHER)
Admission: EM | Admit: 2022-08-28 | Discharge: 2022-08-29 | Disposition: A | Discharge status: Home / Self Care | Payer: Medicare Other | Attending: Emergency Medicine | Admitting: Emergency Medicine

## 2022-08-28 ENCOUNTER — Encounter (HOSPITAL_BASED_OUTPATIENT_CLINIC_OR_DEPARTMENT_OTHER): Payer: Self-pay

## 2022-08-28 ENCOUNTER — Emergency Department (HOSPITAL_BASED_OUTPATIENT_CLINIC_OR_DEPARTMENT_OTHER): Payer: Medicare Other

## 2022-08-28 DIAGNOSIS — R112 Nausea with vomiting, unspecified: Secondary | ICD-10-CM | POA: Insufficient documentation

## 2022-08-28 DIAGNOSIS — Z7982 Long term (current) use of aspirin: Secondary | ICD-10-CM | POA: Diagnosis not present

## 2022-08-28 LAB — COMPREHENSIVE METABOLIC PANEL
ALT: 14 U/L (ref 0–44)
AST: 19 U/L (ref 15–41)
Albumin: 5.2 g/dL — ABNORMAL HIGH (ref 3.5–5.0)
Alkaline Phosphatase: 79 U/L (ref 38–126)
Anion gap: 9 (ref 5–15)
BUN: 18 mg/dL (ref 6–20)
CO2: 30 mmol/L (ref 22–32)
Calcium: 11.3 mg/dL — ABNORMAL HIGH (ref 8.9–10.3)
Chloride: 104 mmol/L (ref 98–111)
Creatinine, Ser: 1.39 mg/dL — ABNORMAL HIGH (ref 0.61–1.24)
GFR, Estimated: >60 mL/min (ref >60)
Glucose, Bld: 112 mg/dL — ABNORMAL HIGH (ref 70–99)
Potassium: 3.8 mmol/L (ref 3.5–5.1)
Sodium: 143 mmol/L (ref 135–145)
Total Bilirubin: 0.8 mg/dL (ref 0.3–1.2)
Total Protein: 8.1 g/dL (ref 6.5–8.1)

## 2022-08-28 LAB — URINALYSIS, ROUTINE W REFLEX MICROSCOPIC
Bacteria, UA: NONE SEEN
Bilirubin Urine: NEGATIVE
Glucose, UA: NEGATIVE mg/dL
Hgb urine dipstick: NEGATIVE
Ketones, ur: 40 mg/dL — AB
Leukocytes,Ua: NEGATIVE
Nitrite: NEGATIVE
Specific Gravity, Urine: >1.046 — ABNORMAL HIGH (ref 1.005–1.030)
pH: 6 (ref 5.0–8.0)

## 2022-08-28 LAB — CBC
HCT: 47.3 % (ref 39.0–52.0)
Hemoglobin: 15.9 g/dL (ref 13.0–17.0)
MCH: 32.3 pg (ref 26.0–34.0)
MCHC: 33.6 g/dL (ref 30.0–36.0)
MCV: 95.9 fL (ref 80.0–100.0)
Platelets: 261 10*3/uL (ref 150–400)
RBC: 4.93 MIL/uL (ref 4.22–5.81)
RDW: 12.9 % (ref 11.5–15.5)
WBC: 11.4 10*3/uL — ABNORMAL HIGH (ref 4.0–10.5)
nRBC: 0 % (ref 0.0–0.2)

## 2022-08-28 LAB — LIPASE, BLOOD: Lipase: 17 U/L (ref 11–51)

## 2022-08-28 MED ORDER — METOCLOPRAMIDE HCL 5 MG/ML IJ SOLN
10.0000 mg | Freq: Once | INTRAMUSCULAR | Status: AC
  Administered 2022-08-28: 10 mg via INTRAVENOUS
  Filled 2022-08-28: qty 2

## 2022-08-28 MED ORDER — PANTOPRAZOLE SODIUM 20 MG PO TBEC: 20.0000 mg | DELAYED_RELEASE_TABLET | Freq: Every day | ORAL | 0 refills | Status: AC

## 2022-08-28 MED ORDER — ONDANSETRON HCL 4 MG/2ML IJ SOLN
4.0000 mg | Freq: Once | INTRAMUSCULAR | Status: AC
  Administered 2022-08-28: 4 mg via INTRAVENOUS
  Filled 2022-08-28: qty 2

## 2022-08-28 MED ORDER — IOHEXOL 300 MG/ML  SOLN
100.0000 mL | Freq: Once | INTRAMUSCULAR | Status: AC | PRN
  Administered 2022-08-28: 70 mL via INTRAVENOUS

## 2022-08-28 MED ORDER — METOCLOPRAMIDE HCL 10 MG PO TABS: 10.0000 mg | ORAL_TABLET | Freq: Four times a day (QID) | ORAL | 0 refills | Status: AC

## 2022-08-28 MED ORDER — SODIUM CHLORIDE 0.9 % IV BOLUS
1000.0000 mL | Freq: Once | INTRAVENOUS | Status: AC
  Administered 2022-08-28: 1000 mL via INTRAVENOUS

## 2022-08-28 MED ORDER — FAMOTIDINE 20 MG PO TABS: 20.0000 mg | ORAL_TABLET | Freq: Two times a day (BID) | ORAL | 0 refills | Status: AC

## 2022-08-28 MED ORDER — PANTOPRAZOLE SODIUM 40 MG IV SOLR
40.0000 mg | Freq: Once | INTRAVENOUS | Status: AC
  Administered 2022-08-28: 40 mg via INTRAVENOUS
  Filled 2022-08-28: qty 10

## 2022-08-28 NOTE — ED Triage Notes (Signed)
Patient here POV from Home.  Endorses N/V since 0200 today. No Diarrhea. Some ABD Discomfort.  NAD Noted during triage. A&Ox4. Gcs 15. Ambulatory.

## 2022-08-28 NOTE — ED Notes (Signed)
Patient given something to drink as PO Challenge, Patient immediately started vomiting.
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
## 2022-08-28 NOTE — Discharge Instructions (Signed)
Please read and follow all provided instructions.  Your diagnoses today include:  1. Nausea and vomiting, unspecified vomiting type     TTests performed today include: Blood cell counts and platelets Kidney and liver function tests Pancreas function test (called lipase) Urine test to look for infection CT scan shows no surgical problems, suggests gastroesophageal reflux Vital signs. See below for your results today.   Medications prescribed:  Reglan - for nausea and vomiting Pantoprazole - stomach acid reducer Pepcid (famotidine) - antihistamine  You can find this medication over-the-counter.   DO NOT exceed:  20mg  Pepcid every 12 hours  Take any prescribed medications only as directed.  Home care instructions:  Follow any educational materials contained in this packet.  Your abdominal pain, nausea, vomiting, and diarrhea may be caused by a viral gastroenteritis also called 'stomach flu'. You should rest for the next several days. Keep drinking plenty of fluids and use the medicine for nausea as directed.   Drink clear liquids for the next 24 hours and introduce solid foods slowly after 24 hours using the b.r.a.t. diet (Bananas, Rice, Applesauce, Toast, Yogurt).    Follow-up instructions: Please follow-up with your primary care provider in the next 2 days for further evaluation of your symptoms. If you are not feeling better in 48 hours you may have a condition that is more serious and you need re-evaluation.   Return instructions:  SEEK IMMEDIATE MEDICAL ATTENTION IF: If you have pain that does not go away or becomes severe  A temperature above 101F develops  Repeated vomiting occurs (multiple episodes)  If you have pain that becomes localized to portions of the abdomen. The right side could possibly be appendicitis. In an adult, the left lower portion of the abdomen could be colitis or diverticulitis.  Blood is being passed in stools or vomit (bright red or black tarry  stools)  You develop chest pain, difficulty breathing, dizziness or fainting, or become confused, poorly responsive, or inconsolable (young children) If you have any other emergent concerns regarding your health  Additional Information: Abdominal (belly) pain can be caused by many things. Your caregiver performed an examination and possibly ordered blood/urine tests and imaging (CT scan, x-rays, ultrasound). Many cases can be observed and treated at home after initial evaluation in the emergency department. Even though you are being discharged home, abdominal pain can be unpredictable. Therefore, you need a repeated exam if your pain does not resolve, returns, or worsens. Most patients with abdominal pain don't have to be admitted to the hospital or have surgery, but serious problems like appendicitis and gallbladder attacks can start out as nonspecific pain. Many abdominal conditions cannot be diagnosed in one visit, so follow-up evaluations are very important.  Your vital signs today were: BP (!) 125/99   Pulse 70   Temp 98 F (36.7 C)   Resp 18   Ht 5\' 5"  (1.651 m)   Wt 52.2 kg   SpO2 96%   BMI 19.14 kg/m  If your blood pressure (bp) was elevated above 135/85 this visit, please have this repeated by your doctor within one month. --------------

## 2022-08-29 NOTE — ED Notes (Signed)
Patient given water

## 2023-03-07 ENCOUNTER — Emergency Department (HOSPITAL_COMMUNITY): Payer: Medicare Other

## 2023-03-07 ENCOUNTER — Emergency Department (HOSPITAL_COMMUNITY)
Admission: EM | Admit: 2023-03-07 | Discharge: 2023-03-08 | Disposition: A | Discharge status: Home / Self Care | Payer: Medicare Other | Attending: Emergency Medicine | Admitting: Emergency Medicine

## 2023-03-07 ENCOUNTER — Other Ambulatory Visit: Payer: Self-pay

## 2023-03-07 DIAGNOSIS — R55 Syncope and collapse: Secondary | ICD-10-CM | POA: Diagnosis not present

## 2023-03-07 DIAGNOSIS — Z7982 Long term (current) use of aspirin: Secondary | ICD-10-CM | POA: Diagnosis not present

## 2023-03-07 DIAGNOSIS — Z79899 Other long term (current) drug therapy: Secondary | ICD-10-CM | POA: Insufficient documentation

## 2023-03-07 DIAGNOSIS — R42 Dizziness and giddiness: Secondary | ICD-10-CM | POA: Diagnosis present
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
## 2023-03-08 LAB — CBC WITH DIFFERENTIAL/PLATELET
Abs Immature Granulocytes: 0.02 10*3/uL (ref 0.00–0.07)
Basophils Absolute: 0.1 10*3/uL (ref 0.0–0.1)
Basophils Relative: 1 %
Eosinophils Absolute: 0.3 10*3/uL (ref 0.0–0.5)
Eosinophils Relative: 3 %
HCT: 42.5 % (ref 39.0–52.0)
Hemoglobin: 13.8 g/dL (ref 13.0–17.0)
Immature Granulocytes: 0 %
Lymphocytes Relative: 37 %
Lymphs Abs: 3.1 10*3/uL (ref 0.7–4.0)
MCH: 31.5 pg (ref 26.0–34.0)
MCHC: 32.5 g/dL (ref 30.0–36.0)
MCV: 97 fL (ref 80.0–100.0)
Monocytes Absolute: 1.1 10*3/uL — ABNORMAL HIGH (ref 0.1–1.0)
Monocytes Relative: 13 %
Neutro Abs: 3.9 10*3/uL (ref 1.7–7.7)
Neutrophils Relative %: 46 %
Platelets: 256 10*3/uL (ref 150–400)
RBC: 4.38 MIL/uL (ref 4.22–5.81)
RDW: 13.3 % (ref 11.5–15.5)
WBC: 8.4 10*3/uL (ref 4.0–10.5)
nRBC: 0 % (ref 0.0–0.2)

## 2023-03-08 LAB — BASIC METABOLIC PANEL
Anion gap: 6 (ref 5–15)
BUN: 17 mg/dL (ref 6–20)
CO2: 27 mmol/L (ref 22–32)
Calcium: 9.6 mg/dL (ref 8.9–10.3)
Chloride: 107 mmol/L (ref 98–111)
Creatinine, Ser: 1.25 mg/dL — ABNORMAL HIGH (ref 0.61–1.24)
GFR, Estimated: >60 mL/min (ref >60)
Glucose, Bld: 99 mg/dL (ref 70–99)
Potassium: 3.7 mmol/L (ref 3.5–5.1)
Sodium: 140 mmol/L (ref 135–145)

## 2023-03-08 NOTE — Discharge Instructions (Signed)
Drink plenty of fluids and get plenty of rest.  Follow-up with your primary doctor/neurologist as scheduled.  Return to the ER if symptoms significantly worsen or change.
